# Patient Record
Sex: Female | Born: 1995 | Race: Black or African American | Hispanic: No | Marital: Single | Smoking: Never smoker
Health system: Southern US, Community
[De-identification: ages and names within clinical notes are randomized; demographics above are authoritative.]

## PROBLEM LIST (undated history)

## (undated) DIAGNOSIS — D649 Anemia, unspecified: Secondary | ICD-10-CM

## (undated) DIAGNOSIS — O1490 Unspecified pre-eclampsia, unspecified trimester: Secondary | ICD-10-CM

## (undated) HISTORY — PX: NO PAST SURGERIES: SHX2092

---

## 2016-02-14 ENCOUNTER — Encounter (HOSPITAL_COMMUNITY): Payer: Self-pay | Admitting: Emergency Medicine

## 2016-02-14 ENCOUNTER — Emergency Department (HOSPITAL_COMMUNITY)
Admission: EM | Admit: 2016-02-14 | Discharge: 2016-02-15 | Disposition: A | Discharge status: Home / Self Care | Payer: BLUE CROSS/BLUE SHIELD | Attending: Emergency Medicine | Admitting: Emergency Medicine

## 2016-02-14 DIAGNOSIS — J029 Acute pharyngitis, unspecified: Secondary | ICD-10-CM | POA: Insufficient documentation

## 2016-02-14 DIAGNOSIS — R319 Hematuria, unspecified: Secondary | ICD-10-CM | POA: Diagnosis present

## 2016-02-14 DIAGNOSIS — N390 Urinary tract infection, site not specified: Secondary | ICD-10-CM | POA: Diagnosis not present

## 2016-02-14 DIAGNOSIS — Z3202 Encounter for pregnancy test, result negative: Secondary | ICD-10-CM | POA: Diagnosis not present

## 2016-02-14 MED ORDER — NAPROXEN 500 MG PO TABS
500.0000 mg | ORAL_TABLET | Freq: Once | ORAL | Status: AC
  Administered 2016-02-15: 500 mg via ORAL
  Filled 2016-02-14: qty 1

## 2016-02-14 NOTE — ED Notes (Signed)
Pt states she has not felt well for the past couple of days  Pt is c/o sore throat, general fatigue, cough, decreased appetite, and chills  Pt states today she coughed up some blood

## 2016-02-14 NOTE — ED Provider Notes (Signed)
CSN: [id number]     Arrival date & time 02/14/16  2209 History  By signing my name below, I, [facility], attest that this documentation has been prepared under the direction and in the presence of [employer], PA-C. Electronically Signed: Randell Patient, ED Scribe. 02/15/2016. 1:51 AM.   Chief Complaint  Patient presents with  . Sore Throat  . Fatigue   The history is provided by the patient. No language interpreter was used.   HPI Comments: Belkis Norbeck is a 20 y.o. female with who presents to the Emergency Department complaining of intermittent mild, unchanging productive cough onset 2 days ago. Patient reports general malaise for the past 2 days a followed by sore throat and a cough productive of bright red blood-tinged mucus. She endorses constant lower back pain, chills, hematuria, and frequency that presented simultaneously with her other symptoms. She has not taken any medications. Per patient, she has a hx of bladder infections but notes that these past episodes did not occur with a sore throat or cough. She denies dysuria and bladder incontinence.   History reviewed. No pertinent past medical history. History reviewed. No pertinent past surgical history. History reviewed. No pertinent family history. Social History  Substance Use Topics  . Smoking status: Never Smoker   . Smokeless tobacco: None  . Alcohol Use: No   OB History    No data available      Review of Systems  Constitutional: Positive for chills.  HENT: Positive for sore throat.   Respiratory: Positive for cough.   Genitourinary: Positive for frequency and hematuria. Negative for dysuria.  All other systems reviewed and are negative.   Allergies  Review of patient's allergies indicates no known allergies.  Home Medications   Prior to Admission medications   Medication Sig Start Date End Date Taking? Authorizing Provider  cephALEXin (KEFLEX) 500 MG capsule Take 1 capsule (500 mg total) by  mouth 2 (two) times daily. 02/15/16   Antony Madura, PA-C  ibuprofen (ADVIL,MOTRIN) 600 MG tablet Take 1 tablet (600 mg total) by mouth every 6 (six) hours as needed. 02/15/16   Antony Madura, PA-C   BP 106/65 mmHg  Pulse 90  Temp(Src) 98.1 F (36.7 C) (Oral)  Resp 16  SpO2 100%  LMP 01/29/2016 (Exact Date)   Physical Exam  Constitutional: She is oriented to person, place, and time. She appears well-developed and well-nourished. No distress.  Nontoxic/nonseptic appearing  HENT:  Head: Normocephalic and atraumatic.  Mouth/Throat: Oropharynx is clear and moist. No oropharyngeal exudate.  Oropharynx clear. Uvula midline. Patient tolerating secretions without difficulty.  Eyes: Conjunctivae and EOM are normal. No scleral icterus.  Neck: Normal range of motion. Neck supple. No tracheal deviation present.  Cardiovascular: Normal rate, regular rhythm and intact distal pulses.   Pulmonary/Chest: Effort normal and breath sounds normal. No respiratory distress. She has no wheezes. She has no rales.  Abdominal: Soft. She exhibits no distension. There is no tenderness. There is no rebound.  Musculoskeletal: Normal range of motion.  Neurological: She is alert and oriented to person, place, and time. She exhibits normal muscle tone. Coordination normal.  Ambulatory with steady gait.  Skin: Skin is warm and dry. No rash noted. She is not diaphoretic. No erythema. No pallor.  Psychiatric: She has a normal mood and affect. Her behavior is normal.  Nursing note and vitals reviewed.   ED Course  Procedures   DIAGNOSTIC STUDIES: Oxygen Saturation is 99% on RA, normal by my interpretation.  COORDINATION OF CARE: 12:00 AM Will order labs and medications. Discussed treatment plan with pt at bedside and pt agreed to plan.  Labs Review Labs Reviewed  URINALYSIS, ROUTINE W REFLEX MICROSCOPIC (NOT AT [facility]) - Abnormal; Notable for the following:    APPearance CLOUDY (*)    Hgb urine dipstick TRACE (*)     Nitrite POSITIVE (*)    Leukocytes, UA MODERATE (*)    All other components within normal limits  URINE MICROSCOPIC-ADD ON - Abnormal; Notable for the following:    Squamous Epithelial / LPF 0-5 (*)    Bacteria, UA MANY (*)    Crystals CA OXALATE CRYSTALS (*)    All other components within normal limits  RAPID STREP SCREEN (NOT AT [facility])  CULTURE, GROUP A STREP [facility])  URINE CULTURE  PREGNANCY, URINE    Imaging Review No results found.   I have personally reviewed and evaluated these images and lab results as part of my medical decision-making.   EKG Interpretation None      MDM   Final diagnoses:  UTI (lower urinary tract infection)    Pt has been diagnosed with a UTI. Pt is afebrile, no CVA tenderness, and normotensive. No c/o nausea. No vomiting since ED arrival. Pt to be dc home with antibiotics and instructions to follow up with PCP if symptoms persist. Return precautions given at discharge. Patient discharged in good condition with no unaddressed concerns  I personally performed the services described in this documentation, which was scribed in my presence. The recorded information has been reviewed and is accurate.    Filed Vitals:   02/14/16 2225 02/15/16 0149  BP: 117/79 106/65  Pulse: 96 90  Temp: 98.1 F (36.7 C)   TempSrc: Oral   Resp: 18 16  SpO2: 99% 100%      Antony Madura, PA-C 02/15/16 0153  Raeford Razor, MD 02/15/16 2253

## 2016-02-15 DIAGNOSIS — N390 Urinary tract infection, site not specified: Secondary | ICD-10-CM | POA: Diagnosis not present

## 2016-02-15 LAB — URINE MICROSCOPIC-ADD ON

## 2016-02-15 LAB — URINALYSIS, ROUTINE W REFLEX MICROSCOPIC
Bilirubin Urine: NEGATIVE
GLUCOSE, UA: NEGATIVE mg/dL
Ketones, ur: NEGATIVE mg/dL
Nitrite: POSITIVE — AB
PROTEIN: NEGATIVE mg/dL
SPECIFIC GRAVITY, URINE: 1.028 (ref 1.005–1.030)
pH: 5.5 (ref 5.0–8.0)

## 2016-02-15 LAB — RAPID STREP SCREEN (MED CTR MEBANE ONLY): Streptococcus, Group A Screen (Direct): NEGATIVE

## 2016-02-15 LAB — PREGNANCY, URINE: Preg Test, Ur: NEGATIVE

## 2016-02-15 MED ORDER — CEPHALEXIN 500 MG PO CAPS: 500.0000 mg | ORAL_CAPSULE | Freq: Two times a day (BID) | ORAL | Status: DC

## 2016-02-15 MED ORDER — IBUPROFEN 600 MG PO TABS: 600.0000 mg | ORAL_TABLET | Freq: Four times a day (QID) | ORAL | Status: DC | PRN

## 2016-02-15 NOTE — Discharge Instructions (Signed)

## 2016-02-17 LAB — URINE CULTURE

## 2016-02-17 LAB — CULTURE, GROUP A STREP (THRC)

## 2016-02-19 ENCOUNTER — Telehealth (HOSPITAL_COMMUNITY): Payer: Self-pay

## 2016-02-19 NOTE — Telephone Encounter (Signed)
Post ED Visit - Positive Culture Follow-up  Culture report reviewed by antimicrobial stewardship pharmacist:   Enzo Bi, Pharm.D.  Celedonio Miyamoto, Pharm.D., BCPS  Garvin Fila, Pharm.D.  Georgina Pillion, Pharm.D., BCPS  [place], [address].D., BCPS, AAHIVP  Estella Husk, Pharm.D., BCPS, AAHIVP  Cassie Stewart, Pharm.D.  Sherle Poe, [address].D. Carmon Sails Rumbarger, Pharm.D.  Positive urine culture, >/= 100,000 colonies -> E Coli Treated with Cephalexin, organism sensitive to the same and no further patient follow-up is required at this time.  Arvid Right 02/19/2016, 5:46 AM

## 2017-01-31 ENCOUNTER — Emergency Department (HOSPITAL_COMMUNITY)
Admission: EM | Admit: 2017-01-31 | Discharge: 2017-01-31 | Disposition: A | Discharge status: Home / Self Care | Payer: Medicaid Other | Attending: Emergency Medicine | Admitting: Emergency Medicine

## 2017-01-31 ENCOUNTER — Emergency Department (HOSPITAL_COMMUNITY): Payer: Medicaid Other

## 2017-01-31 ENCOUNTER — Encounter (HOSPITAL_COMMUNITY): Payer: Self-pay

## 2017-01-31 DIAGNOSIS — Z79899 Other long term (current) drug therapy: Secondary | ICD-10-CM | POA: Insufficient documentation

## 2017-01-31 DIAGNOSIS — R05 Cough: Secondary | ICD-10-CM | POA: Diagnosis present

## 2017-01-31 DIAGNOSIS — J029 Acute pharyngitis, unspecified: Secondary | ICD-10-CM | POA: Insufficient documentation

## 2017-01-31 DIAGNOSIS — R6889 Other general symptoms and signs: Secondary | ICD-10-CM

## 2017-01-31 LAB — RAPID STREP SCREEN (MED CTR MEBANE ONLY): STREPTOCOCCUS, GROUP A SCREEN (DIRECT): NEGATIVE

## 2017-01-31 MED ORDER — HYDROCODONE-ACETAMINOPHEN 7.5-325 MG/15ML PO SOLN: 15.0000 mL | Freq: Three times a day (TID) | ORAL | 0 refills | Status: DC | PRN

## 2017-01-31 MED ORDER — PROMETHAZINE HCL 25 MG PO TABS: 25.0000 mg | ORAL_TABLET | Freq: Four times a day (QID) | ORAL | 0 refills | Status: DC | PRN

## 2017-01-31 MED ORDER — IBUPROFEN 600 MG PO TABS: 600.0000 mg | ORAL_TABLET | Freq: Four times a day (QID) | ORAL | 0 refills | Status: DC | PRN

## 2017-01-31 MED ORDER — BENZONATATE 100 MG PO CAPS
200.0000 mg | ORAL_CAPSULE | Freq: Once | ORAL | Status: AC
  Administered 2017-01-31: 200 mg via ORAL
  Filled 2017-01-31: qty 2

## 2017-01-31 MED ORDER — ACETAMINOPHEN 325 MG PO TABS
650.0000 mg | ORAL_TABLET | Freq: Once | ORAL | Status: AC | PRN
  Administered 2017-01-31: 650 mg via ORAL
  Filled 2017-01-31: qty 2

## 2017-01-31 MED ORDER — SODIUM CHLORIDE 0.9 % IV BOLUS (SEPSIS)
1000.0000 mL | Freq: Once | INTRAVENOUS | Status: AC
  Administered 2017-01-31: 1000 mL via INTRAVENOUS

## 2017-01-31 MED ORDER — BENZONATATE 100 MG PO CAPS: 100.0000 mg | ORAL_CAPSULE | Freq: Three times a day (TID) | ORAL | 0 refills | Status: DC

## 2017-01-31 MED ORDER — KETOROLAC TROMETHAMINE 30 MG/ML IJ SOLN
30.0000 mg | Freq: Once | INTRAMUSCULAR | Status: AC
  Administered 2017-01-31: 30 mg via INTRAVENOUS
  Filled 2017-01-31: qty 1

## 2017-01-31 MED ORDER — ACETAMINOPHEN 500 MG PO TABS: 500.0000 mg | ORAL_TABLET | ORAL | 0 refills | Status: DC | PRN

## 2017-01-31 NOTE — ED Provider Notes (Signed)
[facility]-EMERGENCY DEPT Provider Note   CSN: [id number] Arrival date & time: 01/31/17  0015    History   Chief Complaint Chief Complaint  Patient presents with  . Cough    HPI Christina Velazquez is a 21 y.o. female.   21 year-old female presents to the emergency department for evaluation of cough 1 day. Symptoms associated with sore throat as well as congestion. Patient states that she became concerned because she coughed up mucus today that was streaked with blood. No clots or hematemesis. She did have one episode of emesis which was posttussive. She has not had any diarrhea, inability to swallow, drooling, shortness of breath, or sick contacts. No medications taken at home. Patient noted to be febrile on arrival to the ED.   The history is provided by the patient. No language interpreter was used.  Cough     History reviewed. No pertinent past medical history.  There are no active problems to display for this patient.   History reviewed. No pertinent surgical history.  OB History    No data available       Home Medications    Prior to Admission medications   Medication Sig Start Date End Date Taking? Authorizing Provider  medroxyPROGESTERone (DEPO-PROVERA) 150 MG/ML injection Inject 150 mg into the muscle every 3 (three) months.   Yes Historical Provider, MD  acetaminophen (TYLENOL) 500 MG tablet Take 1 tablet (500 mg total) by mouth every 4 (four) hours as needed for fever. 01/31/17   Antony MaduraKelly Dreamer Carillo, PA-C  benzonatate (TESSALON) 100 MG capsule Take 1 capsule (100 mg total) by mouth every 8 (eight) hours. 01/31/17   Antony MaduraKelly Mailynn Everly, PA-C  HYDROcodone-acetaminophen (HYCET) 7.5-325 mg/15 ml solution Take 15 mLs by mouth every 8 (eight) hours as needed (for sore throat pain). 01/31/17   Antony MaduraKelly Astrid Vides, PA-C  ibuprofen (ADVIL,MOTRIN) 600 MG tablet Take 1 tablet (600 mg total) by mouth every 6 (six) hours as needed for fever, headache, mild pain or moderate pain. 01/31/17   Antony MaduraKelly Baxter Gonzalez, PA-C    promethazine (PHENERGAN) 25 MG tablet Take 1 tablet (25 mg total) by mouth every 6 (six) hours as needed for nausea or vomiting. 01/31/17   Antony MaduraKelly Medardo Hassing, PA-C    Family History History reviewed. No pertinent family history.  Social History Social History  Substance Use Topics  . Smoking status: Never Smoker  . Smokeless tobacco: Never Used  . Alcohol use No     Allergies   Patient has no known allergies.   Review of Systems Review of Systems  Respiratory: Positive for cough.   Ten systems reviewed and are negative for acute change, except as noted in the HPI.    Physical Exam Updated Vital Signs BP 98/63 (BP Location: Right Arm)   Pulse 83   Temp 98.8 F (37.1 C) (Oral)   Resp 18   LMP 01/09/2017   SpO2 100%   Physical Exam  Constitutional: She is oriented to person, place, and time. She appears well-developed and well-nourished. No distress.  Nontoxic appearing and in no acute distress  HENT:  Head: Normocephalic and atraumatic.  Eyes: Conjunctivae and EOM are normal. No scleral icterus.  Neck: Normal range of motion.  No nuchal rigidity or meningismus  Cardiovascular: Normal rate, regular rhythm and intact distal pulses.   Pulmonary/Chest: Effort normal. No respiratory distress. She has no wheezes. She has no rales.  No cough appreciated. Lungs clear to auscultation bilaterally. Chest expansion symmetric.  Musculoskeletal: Normal range of motion.  Neurological:  She is alert and oriented to person, place, and time. She exhibits normal muscle tone. Coordination normal.  GCS 15. Patient moving all extremities.  Skin: Skin is warm and dry. No rash noted. She is not diaphoretic. No erythema. No pallor.  Psychiatric: She has a normal mood and affect. Her behavior is normal.  Nursing note and vitals reviewed.    ED Treatments / Results  Labs (all labs ordered are listed, but only abnormal results are displayed) Labs Reviewed  RAPID STREP SCREEN (NOT AT [facility])   CULTURE, GROUP A STREP [facility])    EKG  EKG Interpretation None       Radiology Dg Chest 2 View  Result Date: 01/31/2017 CLINICAL DATA:  Fever cough and sore throat EXAM: CHEST  2 VIEW COMPARISON:  None. FINDINGS: The heart size and mediastinal contours are within normal limits. Both lungs are clear. The visualized skeletal structures are unremarkable. IMPRESSION: No active cardiopulmonary disease. Electronically Signed   By: Jasmine Pang M.D.   On: 01/31/2017 01:01    Procedures Procedures (including critical care time)  Medications Ordered in ED Medications  acetaminophen (TYLENOL) tablet 650 mg (650 mg Oral Given 01/31/17 0043)  sodium chloride 0.9 % bolus 1,000 mL (0 mLs Intravenous Stopped 01/31/17 0531)  ketorolac (TORADOL) 30 MG/ML injection 30 mg (30 mg Intravenous Given 01/31/17 0407)  benzonatate (TESSALON) capsule 200 mg (200 mg Oral Given 01/31/17 0407)     Initial Impression / Assessment and Plan / ED Course  I have reviewed the triage vital signs and the nursing notes.  Pertinent labs & imaging results that were available during my care of the patient were reviewed by me and considered in my medical decision making (see chart for details).     Patient with symptoms consistent with viral illness. Suspect influenza given fever, sore throat, cough, congestion, and myalgias. Vitals are stable. Fever responding to antipyretics. No signs of dehydration, tolerating oral fluid. 1L IVF also given. Lungs are clear. CXR without evidence of PNA. No focal abdominal pain. Supportive therapy indicated with return if symptoms worsen. Patient discharged in satisfactory condition with no unaddressed concerns.   Final Clinical Impressions(s) / ED Diagnoses   Final diagnoses:  Flu-like symptoms    New Prescriptions Discharge Medication List as of 01/31/2017  5:30 AM    START taking these medications   Details  acetaminophen (TYLENOL) 500 MG tablet Take 1 tablet (500 mg total) by  mouth every 4 (four) hours as needed for fever., Starting Fri 01/31/2017, Print    benzonatate (TESSALON) 100 MG capsule Take 1 capsule (100 mg total) by mouth every 8 (eight) hours., Starting Fri 01/31/2017, Print    HYDROcodone-acetaminophen (HYCET) 7.5-325 mg/15 ml solution Take 15 mLs by mouth every 8 (eight) hours as needed (for sore throat pain)., Starting Fri 01/31/2017, Print    ibuprofen (ADVIL,MOTRIN) 600 MG tablet Take 1 tablet (600 mg total) by mouth every 6 (six) hours as needed for fever, headache, mild pain or moderate pain., Starting Fri 01/31/2017, Print    promethazine (PHENERGAN) 25 MG tablet Take 1 tablet (25 mg total) by mouth every 6 (six) hours as needed for nausea or vomiting., Starting Fri 01/31/2017, Print         [place], PA-C 01/31/17 1610    Pricilla Loveless, MD 02/03/17 1724

## 2017-01-31 NOTE — ED Triage Notes (Signed)
Pt states that she woke up coughing and spit up some blood, she also complains of being cold, pt has a fever

## 2017-01-31 NOTE — Discharge Instructions (Signed)
Your symptoms are likely due to a viral illness, possibly influenza. We advise the use of supportive care as well as plenty of rest. Drink plenty of clear liquids to prevent dehydration. Take ibuprofen for pain and Tylenol for fever. Follow-up with a primary care doctor if symptoms persist.

## 2017-02-01 LAB — CULTURE, GROUP A STREP (THRC)

## 2017-02-02 ENCOUNTER — Telehealth: Payer: Self-pay

## 2017-02-02 NOTE — Telephone Encounter (Signed)
[place] for symptom check of sore throat. States went to urgent  care and placed on Amoxicillin

## 2017-02-02 NOTE — Progress Notes (Signed)
ED Antimicrobial Stewardship Positive Culture Follow Up   Christina Velazquez is an 21 y.o. female who [facility] with a chief complaint of  Chief Complaint  Patient presents with  . Cough    Recent Results (from the past 720 hour(s))  Rapid strep screen     Status: None   Collection Time: 01/31/17 12:40 AM  Result Value Ref Range Status   Streptococcus, Group A Screen (Direct) NEGATIVE NEGATIVE Final    Comment: (NOTE) A Rapid Antigen test may result negative if the antigen level in the sample is below the detection level of this test. The FDA has not cleared this test as a stand-alone test therefore the rapid antigen negative result has reflexed to a Group A Strep culture.   Culture, group A strep     Status: None   Collection Time: 01/31/17 12:40 AM  Result Value Ref Range Status   Specimen Description THROAT  Final   Special Requests NONE Reflexed from [id number]  Final   Culture MODERATE GROUP A STREP (S.PYOGENES) ISOLATED  Final   Report Status 02/01/2017 FINAL  Final    []  Treated with N/A, organism resistant to prescribed antimicrobial [x]  Patient discharged originally without antimicrobial agent and treatment is now indicated  New antibiotic prescription: IF no improvement, amoxicillin 500mg  PO BID x 10 days  ED Provider: Rhona RaiderMercedes Street, PA   Shaynna Husby, Drake LeachRachel Lynn 02/02/2017, 10:53 AM Clinical Pharmacist Phone# [phone]

## 2017-03-11 ENCOUNTER — Emergency Department (HOSPITAL_COMMUNITY)
Admission: EM | Admit: 2017-03-11 | Discharge: 2017-03-11 | Disposition: A | Discharge status: Home / Self Care | Payer: Medicaid Other | Attending: Dermatology | Admitting: Dermatology

## 2017-03-11 ENCOUNTER — Encounter (HOSPITAL_COMMUNITY): Payer: Self-pay

## 2017-03-11 DIAGNOSIS — Z5321 Procedure and treatment not carried out due to patient leaving prior to being seen by health care provider: Secondary | ICD-10-CM | POA: Diagnosis not present

## 2017-03-11 DIAGNOSIS — M25511 Pain in right shoulder: Secondary | ICD-10-CM | POA: Insufficient documentation

## 2017-03-11 DIAGNOSIS — Y939 Activity, unspecified: Secondary | ICD-10-CM | POA: Diagnosis not present

## 2017-03-11 DIAGNOSIS — Y999 Unspecified external cause status: Secondary | ICD-10-CM | POA: Diagnosis not present

## 2017-03-11 DIAGNOSIS — Y92410 Unspecified street and highway as the place of occurrence of the external cause: Secondary | ICD-10-CM | POA: Insufficient documentation

## 2017-03-11 NOTE — ED Notes (Signed)
Came up to nurse first states she was leaving, encouraged to stay , states she would follow up with her chiropractor
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
## 2017-09-30 ENCOUNTER — Encounter (HOSPITAL_COMMUNITY): Payer: Self-pay

## 2017-09-30 ENCOUNTER — Emergency Department (HOSPITAL_COMMUNITY)
Admission: EM | Admit: 2017-09-30 | Discharge: 2017-09-30 | Disposition: A | Discharge status: Home / Self Care | Payer: BLUE CROSS/BLUE SHIELD | Attending: Emergency Medicine | Admitting: Emergency Medicine

## 2017-09-30 DIAGNOSIS — J029 Acute pharyngitis, unspecified: Secondary | ICD-10-CM | POA: Insufficient documentation

## 2017-09-30 DIAGNOSIS — R059 Cough, unspecified: Secondary | ICD-10-CM

## 2017-09-30 DIAGNOSIS — R05 Cough: Secondary | ICD-10-CM | POA: Diagnosis not present

## 2017-09-30 LAB — RAPID STREP SCREEN (MED CTR MEBANE ONLY): STREPTOCOCCUS, GROUP A SCREEN (DIRECT): NEGATIVE

## 2017-09-30 MED ORDER — BENZONATATE 100 MG PO CAPS: 100.0000 mg | ORAL_CAPSULE | Freq: Three times a day (TID) | ORAL | 0 refills | Status: DC

## 2017-09-30 MED ORDER — IBUPROFEN 400 MG PO TABS
600.0000 mg | ORAL_TABLET | Freq: Once | ORAL | Status: AC
  Administered 2017-09-30: 600 mg via ORAL
  Filled 2017-09-30: qty 1

## 2017-09-30 MED ORDER — DEXAMETHASONE SODIUM PHOSPHATE 10 MG/ML IJ SOLN
10.0000 mg | Freq: Once | INTRAMUSCULAR | Status: AC
  Administered 2017-09-30: 10 mg via INTRAMUSCULAR
  Filled 2017-09-30: qty 1

## 2017-09-30 MED ORDER — LIDOCAINE VISCOUS 2 % MT SOLN: 20.0000 mL | OROMUCOSAL | 0 refills | Status: DC | PRN

## 2017-09-30 NOTE — ED Notes (Signed)
Per provider, okay for nurse first to write pt a work/school note.

## 2017-09-30 NOTE — ED Provider Notes (Signed)
[facility]-EMERGENCY DEPT Provider Note   CSN: [id number] Arrival date & time: 09/30/17  1535     History   Chief Complaint Chief Complaint  Patient presents with  . Sore Throat    HPI Christina Velazquez is a 21 y.o. female.  HPI 21 year old [place] American female presents to the ED for evaluation of sore throat. Patient states that her throat started hurting 3 days ago. States it is painful to swallow. Patient also reports a nonproductive cough for the past 2-3 days. Denies any associated fevers, chills, rhinorrhea, sinus congestion, otalgia. Over-the-counter decongestant which has helped some. She also reports that her voice is slightly hoarse. States that she has history of tonsillitis and is wanting a referral to ENT for possible tonsillectomy. Denies any associated sick contacts. She states her sore throat is worse at night. Denies any associated chest pain, shortness of breath. History reviewed. No pertinent past medical history.  There are no active problems to display for this patient.   History reviewed. No pertinent surgical history.  OB History    No data available       Home Medications    Prior to Admission medications   Medication Sig Start Date End Date Taking? Authorizing Provider  acetaminophen (TYLENOL) 500 MG tablet Take 1 tablet (500 mg total) by mouth every 4 (four) hours as needed for fever. 01/31/17   Antony Madura, PA-C  benzonatate (TESSALON) 100 MG capsule Take 1 capsule (100 mg total) by mouth every 8 (eight) hours. 09/30/17   Rise Mu, PA-C  HYDROcodone-acetaminophen (HYCET) 7.5-325 mg/15 ml solution Take 15 mLs by mouth every 8 (eight) hours as needed (for sore throat pain). 01/31/17   Antony Madura, PA-C  ibuprofen (ADVIL,MOTRIN) 600 MG tablet Take 1 tablet (600 mg total) by mouth every 6 (six) hours as needed for fever, headache, mild pain or moderate pain. 01/31/17   Antony Madura, PA-C  lidocaine (XYLOCAINE) 2 % solution Use as directed 20 mLs in the  mouth or throat as needed for mouth pain. 09/30/17   Rise Mu, PA-C  medroxyPROGESTERone (DEPO-PROVERA) 150 MG/ML injection Inject 150 mg into the muscle every 3 (three) months.    [provider]  promethazine (PHENERGAN) 25 MG tablet Take 1 tablet (25 mg total) by mouth every 6 (six) hours as needed for nausea or vomiting. 01/31/17   Antony Madura, PA-C    Family History History reviewed. No pertinent family history.  Social History Social History  Substance Use Topics  . Smoking status: Never Smoker  . Smokeless tobacco: Never Used  . Alcohol use No     Allergies   Patient has no known allergies.   Review of Systems Review of Systems  Constitutional: Negative for chills and fever.  HENT: Positive for sore throat, trouble swallowing and voice change. Negative for congestion, sinus pain and sinus pressure.   Respiratory: Positive for cough. Negative for shortness of breath and wheezing.   Cardiovascular: Negative for chest pain.  Gastrointestinal: Negative for nausea and vomiting.     Physical Exam Updated Vital Signs BP 104/76 (BP Location: Right Arm)   Pulse 64   Temp 98.5 F (36.9 C) (Oral)   Resp 18   LMP 09/28/2017 (Exact Date)   SpO2 100%   Physical Exam  Constitutional: She appears well-developed and well-nourished. No distress.  HENT:  Head: Normocephalic and atraumatic.  Right Ear: Hearing, tympanic membrane and ear canal normal.  Left Ear: Hearing, tympanic membrane, external ear and ear canal normal.  Nose: Nose normal.  Mouth/Throat: Uvula is midline and mucous membranes are normal. No trismus in the jaw. No uvula swelling. Posterior oropharyngeal erythema present. No oropharyngeal exudate, posterior oropharyngeal edema or tonsillar abscesses. Tonsils are 1+ on the right. Tonsils are 1+ on the left. No tonsillar exudate.  Eyes: Right eye exhibits no discharge. Left eye exhibits no discharge. No scleral icterus.  Neck: Normal range of  motion. Neck supple.  Pulmonary/Chest: Effort normal and breath sounds normal. No respiratory distress. She has no wheezes. She has no rales. She exhibits no tenderness.  Musculoskeletal: Normal range of motion.  Lymphadenopathy:    She has no cervical adenopathy.  Neurological: She is alert.  Skin: No pallor.  Psychiatric: Her behavior is normal. Judgment and thought content normal.  Nursing note and vitals reviewed.    ED Treatments / Results  Labs (all labs ordered are listed, but only abnormal results are displayed) Labs Reviewed  RAPID STREP SCREEN (NOT AT [facility])  CULTURE, GROUP A STREP [facility])    EKG  EKG Interpretation None       Radiology No results found.  Procedures Procedures (including critical care time)  Medications Ordered in ED Medications  dexamethasone (DECADRON) injection 10 mg (not administered)  ibuprofen (ADVIL,MOTRIN) tablet 600 mg (not administered)     Initial Impression / Assessment and Plan / ED Course  I have reviewed the triage vital signs and the nursing notes.  Pertinent labs & imaging results that were available during my care of the patient were reviewed by me and considered in my medical decision making (see chart for details).     Patient presents to the ED with complaints of sore throat and nonproductive cough for the past 3 days. Denies any associated fever, chills, rhinorrhea, otalgia, chest pain, shortness of breath. Patient is overall well-appearing and nontoxic. Vital signs are reassuring. Lungs clear to auscultation bilaterally. No signs of peritonsillar abscess and doubt deep neck infection. Strep test was negative. The patient's symptoms likely consistent with laryngitis. Low suspicion for pneumonia and do not feel that any imaging is indicated at this time. Patient was treated with steroids and cough medicine. Encouraged him to medical treatment at home. Have given her ENT follow-up.  Pt is hemodynamically stable, in NAD, &  able to ambulate in the ED. Evaluation does not show pathology that would require ongoing emergent intervention or inpatient treatment. I explained the diagnosis to the patient. Pain has been managed & has no complaints prior to dc. Pt is comfortable with above plan and is stable for discharge at this time. All questions were answered prior to disposition. Strict return precautions for f/u to the ED were discussed. Encouraged follow up with PCP.   Final Clinical Impressions(s) / ED Diagnoses   Final diagnoses:  Sore throat  Cough    New Prescriptions New Prescriptions   BENZONATATE (TESSALON) 100 MG CAPSULE    Take 1 capsule (100 mg total) by mouth every 8 (eight) hours.   LIDOCAINE (XYLOCAINE) 2 % SOLUTION    Use as directed 20 mLs in the mouth or throat as needed for mouth pain.     Rise Mu, PA-C 09/30/17 1641    Benjiman Core, MD 09/30/17 [phone]

## 2017-09-30 NOTE — ED Triage Notes (Signed)
Pt reports sore throat X3 days. She reports cough as well. Reports sore throat is worse at night. Mild tonsillar inflammation noted. Voice slightly hoarse, airway intact.

## 2017-09-30 NOTE — Discharge Instructions (Signed)
Your strep test was negative. Had been given steroids in the ED. Take Motrin and Tylenol home for pain. Have your Tessalon for your cough. Use the lidocaine viscus rinse to help with her sore throat. Follow up with a primary care doctor if her symptoms do not improve in the next 6-7 days.

## 2017-09-30 NOTE — ED Notes (Signed)
Declined W/C at D/C and was escorted to lobby by RN. 

## 2017-10-01 LAB — CULTURE, GROUP A STREP (THRC)

## 2017-10-05 ENCOUNTER — Encounter (HOSPITAL_COMMUNITY): Payer: Self-pay | Admitting: Emergency Medicine

## 2017-10-05 ENCOUNTER — Ambulatory Visit (HOSPITAL_COMMUNITY)
Admission: EM | Admit: 2017-10-05 | Discharge: 2017-10-05 | Disposition: A | Discharge status: Home / Self Care | Payer: BLUE CROSS/BLUE SHIELD | Attending: Emergency Medicine | Admitting: Emergency Medicine

## 2017-10-05 DIAGNOSIS — K1379 Other lesions of oral mucosa: Secondary | ICD-10-CM

## 2017-10-05 MED ORDER — IBUPROFEN 800 MG PO TABS
800.0000 mg | ORAL_TABLET | Freq: Once | ORAL | Status: AC
  Administered 2017-10-05: 800 mg via ORAL

## 2017-10-05 MED ORDER — IBUPROFEN 600 MG PO TABS: 600.0000 mg | ORAL_TABLET | Freq: Three times a day (TID) | ORAL | 0 refills | Status: DC | PRN

## 2017-10-05 MED ORDER — IBUPROFEN 800 MG PO TABS
ORAL_TABLET | ORAL | Status: AC
  Filled 2017-10-05: qty 1

## 2017-10-05 MED ORDER — AMOXICILLIN-POT CLAVULANATE 875-125 MG PO TABS
1.0000 | ORAL_TABLET | Freq: Two times a day (BID) | ORAL | 0 refills | Status: DC
Start: 2017-10-05 — End: 2019-07-09

## 2017-10-05 NOTE — ED Provider Notes (Signed)
[facility]    CSN: [id number] Arrival date & time: 10/05/17  1819     History   Chief Complaint Chief Complaint  Patient presents with  . Dental Pain    HPI  Christina Velazquez is a 21 y.o. female.  The patient presenting today with complaints of pain in the "roof of her mouth". Patient states that she was seen the other day for similar symptoms and was given something to swish which temporarily relieved the discomfort. Patient states she has been sick for approximately one week.  States her mouth is uncomfortable she does not feel like she can eat. Denies sucking on anything that could've caused any injury or discomfort. Denies significant medical history or medications. States she was given a steroid shot a few days ago but has not been placed on any antibiotics.  HPI  History reviewed. No pertinent past medical history.  There are no active problems to display for this patient.   History reviewed. No pertinent surgical history.  OB History    No data available       Home Medications    Prior to Admission medications   Medication Sig Start Date End Date Taking? Authorizing Provider  acetaminophen (TYLENOL) 500 MG tablet Take 1 tablet (500 mg total) by mouth every 4 (four) hours as needed for fever. 01/31/17   Antony Madura, PA-C  amoxicillin-clavulanate (AUGMENTIN) 875-125 MG tablet Take 1 tablet by mouth every 12 (twelve) hours. 10/05/17   Servando Salina, NP  benzonatate (TESSALON) 100 MG capsule Take 1 capsule (100 mg total) by mouth every 8 (eight) hours. 09/30/17   Rise Mu, PA-C  HYDROcodone-acetaminophen (HYCET) 7.5-325 mg/15 ml solution Take 15 mLs by mouth every 8 (eight) hours as needed (for sore throat pain). 01/31/17   Antony Madura, PA-C  ibuprofen (ADVIL,MOTRIN) 600 MG tablet Take 1 tablet (600 mg total) by mouth every 8 (eight) hours as needed. 10/05/17   Servando Salina, NP  lidocaine (XYLOCAINE) 2 % solution Use as directed 20 mLs in the  mouth or throat as needed for mouth pain. 09/30/17   Rise Mu, PA-C  medroxyPROGESTERone (DEPO-PROVERA) 150 MG/ML injection Inject 150 mg into the muscle every 3 (three) months.    [provider]  promethazine (PHENERGAN) 25 MG tablet Take 1 tablet (25 mg total) by mouth every 6 (six) hours as needed for nausea or vomiting. 01/31/17   Antony Madura, PA-C    Family History History reviewed. No pertinent family history.  Social History Social History  Substance Use Topics  . Smoking status: Never Smoker  . Smokeless tobacco: Never Used  . Alcohol use No     Allergies   Patient has no known allergies.   Review of Systems Review of Systems  Constitutional: Negative.  Negative for fatigue and fever.  HENT: Negative.   Eyes: Negative.  Negative for visual disturbance.  Respiratory: Negative.  Negative for cough and shortness of breath.   Cardiovascular: Negative.  Negative for chest pain and leg swelling.  Gastrointestinal: Negative.   Endocrine: Negative.   Genitourinary: Negative.   Musculoskeletal: Negative.  Negative for gait problem and neck stiffness.  Skin: Negative.   Allergic/Immunologic: Negative.   Neurological: Negative.  Negative for dizziness and headaches.  Hematological: Negative.   Psychiatric/Behavioral: Negative.      Physical Exam Triage Vital Signs ED Triage Vitals [10/05/17 1832]  Enc Vitals Group     BP 124/78     Pulse Rate 67  Resp 18     Temp 98.4 F (36.9 C)     Temp Source Oral     SpO2 100 %     Weight      Height      Head Circumference      Peak Flow      Pain Score 9     Pain Loc      Pain Edu?      Excl. in GC?    No data found.   Updated Vital Signs BP 124/78 (BP Location: Right Arm)   Pulse 67   Temp 98.4 F (36.9 C) (Oral)   Resp 18   LMP 09/28/2017 (Exact Date)   SpO2 100%   Visual Acuity Right Eye Distance:   Left Eye Distance:   Bilateral Distance:    Right Eye Near:   Left Eye Near:      Bilateral Near:     Physical Exam  Constitutional: She appears well-developed and well-nourished. No distress.  HENT:  Mouth/Throat: Uvula is midline and mucous membranes are normal. No tonsillar exudate.    Eyes: Pupils are equal, round, and reactive to light. Conjunctivae are normal. Right eye exhibits no discharge. Left eye exhibits no discharge. No scleral icterus.  Neck: Normal range of motion. Neck supple. No thyromegaly present.  Cardiovascular: Normal rate, regular rhythm, normal heart sounds and intact distal pulses.  Exam reveals no gallop and no friction rub.   No murmur heard. Pulmonary/Chest: Effort normal and breath sounds normal. No respiratory distress. She has no wheezes. She has no rales. She exhibits no tenderness.  Skin: She is not diaphoretic.  Nursing note and vitals reviewed.    UC Treatments / Results  Labs (all labs ordered are listed, but only abnormal results are displayed) Labs Reviewed - No data to display  EKG  EKG Interpretation None       Radiology No results found.  Procedures Procedures (including critical care time)  Medications Ordered in UC Medications  ibuprofen (ADVIL,MOTRIN) tablet 800 mg (800 mg Oral Given 10/05/17 1851)     Initial Impression / Assessment and Plan / UC Course  I have reviewed the triage vital signs and the nursing notes.  Pertinent labs & imaging results that were available during my care of the patient were reviewed by me and considered in my medical decision making (see chart for details).     Patient given ibuprofen for pain and placed on an antibiotic.  Concern for abscess/infection.  To call ENT to follow up in am or go to ED if symptoms require to be seen sooner.   Plan of care was discussed with Dr. Ria Clock.   Final Clinical Impressions(s) / UC Diagnoses   Final diagnoses:  Mouth pain    New Prescriptions Discharge Medication List as of 10/05/2017  6:47 PM    START taking these  medications   Details  amoxicillin-clavulanate (AUGMENTIN) 875-125 MG tablet Take 1 tablet by mouth every 12 (twelve) hours., Starting Sun 10/05/2017, Normal        Controlled Substance Prescriptions [place] Controlled Substance Registry consulted? Not Applicable   The usual and customary discharge instructions and warnings were given.  The patient verbalizes understanding and agrees to plan of care.      Servando Salina, NP 10/05/17 Windell Moment

## 2017-10-05 NOTE — ED Triage Notes (Signed)
Pt sts pain in roof of mouth x 3 days

## 2017-10-05 NOTE — Discharge Instructions (Signed)
Call Dr. Thurmon Fair office tomorrow and schedule a visit for follow up.

## 2018-12-20 ENCOUNTER — Emergency Department (HOSPITAL_COMMUNITY)
Admission: EM | Admit: 2018-12-20 | Discharge: 2018-12-20 | Disposition: A | Discharge status: Home / Self Care | Payer: Self-pay | Attending: Emergency Medicine | Admitting: Emergency Medicine

## 2018-12-20 ENCOUNTER — Emergency Department (HOSPITAL_COMMUNITY): Payer: Self-pay

## 2018-12-20 ENCOUNTER — Other Ambulatory Visit: Payer: Self-pay

## 2018-12-20 DIAGNOSIS — W208XXA Other cause of strike by thrown, projected or falling object, initial encounter: Secondary | ICD-10-CM | POA: Insufficient documentation

## 2018-12-20 DIAGNOSIS — Y990 Civilian activity done for income or pay: Secondary | ICD-10-CM | POA: Insufficient documentation

## 2018-12-20 DIAGNOSIS — Y939 Activity, unspecified: Secondary | ICD-10-CM | POA: Insufficient documentation

## 2018-12-20 DIAGNOSIS — S90111A Contusion of right great toe without damage to nail, initial encounter: Secondary | ICD-10-CM | POA: Insufficient documentation

## 2018-12-20 DIAGNOSIS — Y9289 Other specified places as the place of occurrence of the external cause: Secondary | ICD-10-CM | POA: Insufficient documentation

## 2018-12-20 DIAGNOSIS — Z79899 Other long term (current) drug therapy: Secondary | ICD-10-CM | POA: Insufficient documentation

## 2018-12-20 MED ORDER — IBUPROFEN 400 MG PO TABS
600.0000 mg | ORAL_TABLET | Freq: Once | ORAL | Status: AC
  Administered 2018-12-20: 600 mg via ORAL
  Filled 2018-12-20: qty 1

## 2018-12-20 MED ORDER — IBUPROFEN 600 MG PO TABS: 600.0000 mg | ORAL_TABLET | Freq: Three times a day (TID) | ORAL | 0 refills | Status: DC | PRN

## 2018-12-20 NOTE — Discharge Instructions (Addendum)
Please read instructions below. Apply ice to your toe for 20 minutes at a time. You can take ibuprofen every 6 hours as needed for pain. Schedule an appointment with your primary care provider if symptoms persist.  Return to the ER for new or concerning symptoms.

## 2018-12-20 NOTE — ED Notes (Signed)
Patient verbalizes understanding of discharge instructions. Opportunity for questioning and answers were provided. Armband removed by staff, pt discharged from ED ambulatory.   

## 2018-12-20 NOTE — ED Provider Notes (Signed)
[facility] Provider [facility]   CSN: [id number] Arrival date & time: 12/20/18  1344     History   Chief Complaint No chief complaint on file.   HPI Christina Velazquez is a 22 y.o. female without significant PMHx, presenting from work with right foot pain after a wooden shelf fell onto her foot.  She has associated pain and brusing to the first toe, at the base of the toe nail. Pain is made worse with palpation, mild.  Pain is 5/10. No medications tried PTA.  The history is provided by the patient.    No past medical history on file.  There are no active problems to display for this patient.   No past surgical history on file.   OB History   No obstetric history on file.      Home Medications    Prior to Admission medications   Medication Sig Start Date End Date Taking? Authorizing Provider  acetaminophen (TYLENOL) 500 MG tablet Take 1 tablet (500 mg total) by mouth every 4 (four) hours as needed for fever. 01/31/17   Antony MaduraHumes, Kelly, PA-C  amoxicillin-clavulanate (AUGMENTIN) 875-125 MG tablet Take 1 tablet by mouth every 12 (twelve) hours. 10/05/17   Servando Salinaossi, Catherine H, NP  benzonatate (TESSALON) 100 MG capsule Take 1 capsule (100 mg total) by mouth every 8 (eight) hours. 09/30/17   Rise MuLeaphart, Kenneth T, PA-C  HYDROcodone-acetaminophen (HYCET) 7.5-325 mg/15 ml solution Take 15 mLs by mouth every 8 (eight) hours as needed (for sore throat pain). 01/31/17   Antony MaduraHumes, Kelly, PA-C  ibuprofen (ADVIL,MOTRIN) 600 MG tablet Take 1 tablet (600 mg total) by mouth every 8 (eight) hours as needed. 10/05/17   Servando Salinaossi, Catherine H, NP  lidocaine (XYLOCAINE) 2 % solution Use as directed 20 mLs in the mouth or throat as needed for mouth pain. 09/30/17   Rise MuLeaphart, Kenneth T, PA-C  medroxyPROGESTERone (DEPO-PROVERA) 150 MG/ML injection Inject 150 mg into the muscle every 3 (three) months.    [provider]  promethazine (PHENERGAN) 25 MG tablet Take 1 tablet (25 mg  total) by mouth every 6 (six) hours as needed for nausea or vomiting. 01/31/17   Antony MaduraHumes, Kelly, PA-C    Family History No family history on file.  Social History Social History   Tobacco Use  . Smoking status: Never Smoker  . Smokeless tobacco: Never Used  Substance Use Topics  . Alcohol use: No  . Drug use: No     Allergies   Patient has no known allergies.   Review of Systems Review of Systems  Musculoskeletal: Positive for myalgias.  Skin: Positive for color change. Negative for wound.     Physical Exam Updated Vital Signs BP 93/62 (BP Location: Left Arm)   Pulse 68   Temp 98.3 F (36.8 C) (Oral)   Ht 4\' 10"  (1.473 m)   Wt 54.4 kg   LMP 12/17/2018 (Exact Date)   SpO2 100%   BMI 25.08 kg/m   Physical Exam Vitals signs and nursing note reviewed.  Constitutional:      General: She is not in acute distress.    Appearance: She is well-developed.  HENT:     Head: Normocephalic and atraumatic.  Eyes:     Conjunctiva/sclera: Conjunctivae normal.  Cardiovascular:     Rate and Rhythm: Normal rate.  Pulmonary:     Effort: Pulmonary effort is normal.  Musculoskeletal:     Comments: There is localized area of bruising to the some of the  distal right first toe at the base of the toenail.  Appears to be a blood-filled blister.  Associated tenderness noted.  Normal active range of motion.  No obvious swelling or deformity.  No subungual hematoma or damage to the nail.  Neurological:     Mental Status: She is alert.  Psychiatric:        Mood and Affect: Mood normal.        Behavior: Behavior normal.      ED Treatments / Results  Labs (all labs ordered are listed, but only abnormal results are displayed) Labs Reviewed - No data to display  EKG None  Radiology No results found.  Procedures Procedures (including critical care time)  Medications Ordered in ED Medications - No data to display   Initial Impression / Assessment and Plan / ED Course  I have  reviewed the triage vital signs and the nursing notes.  Pertinent labs & imaging results that were available during my care of the patient were reviewed by me and considered in my medical decision making (see chart for details).     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  Associated mild pain and bruising to the skin at the base of the toenail. No subungual hematoma or damage to the nail.  Normal range of motion.  X-ray ordered to rule out fracture. Pain treated with ibuprofen.  Care handed off at shift change to PA [place].  Follow-up x-ray results.  If negative, conservative treatment with RICE, NSAIDs for pain.  Final Clinical Impressions(s) / ED Diagnoses   Final diagnoses:  None    ED Discharge Orders    None       Kimaya Whitlatch, [place] N, PA-C 12/20/18 1441    Gwyneth SproutPlunkett, Whitney, MD 12/20/18 1918

## 2018-12-20 NOTE — ED Triage Notes (Signed)
Pt presents to ED for after a board fell on her foot at work today. Pt denies difficulty ambulating. Slight swelling per pt.

## 2019-07-09 ENCOUNTER — Encounter (HOSPITAL_COMMUNITY): Payer: Self-pay | Admitting: Emergency Medicine

## 2019-07-09 ENCOUNTER — Ambulatory Visit (HOSPITAL_COMMUNITY)
Admission: EM | Admit: 2019-07-09 | Discharge: 2019-07-09 | Disposition: A | Discharge status: Home / Self Care | Payer: Medicaid Other | Attending: Emergency Medicine | Admitting: Emergency Medicine

## 2019-07-09 ENCOUNTER — Other Ambulatory Visit: Payer: Self-pay

## 2019-07-09 ENCOUNTER — Telehealth: Payer: Self-pay | Admitting: Family Medicine

## 2019-07-09 ENCOUNTER — Emergency Department (HOSPITAL_COMMUNITY)
Admission: EM | Admit: 2019-07-09 | Discharge: 2019-07-10 | Disposition: A | Discharge status: Home / Self Care | Payer: Medicaid Other | Attending: Emergency Medicine | Admitting: Emergency Medicine

## 2019-07-09 DIAGNOSIS — Z3A00 Weeks of gestation of pregnancy not specified: Secondary | ICD-10-CM | POA: Insufficient documentation

## 2019-07-09 DIAGNOSIS — K219 Gastro-esophageal reflux disease without esophagitis: Secondary | ICD-10-CM | POA: Diagnosis not present

## 2019-07-09 DIAGNOSIS — Z3201 Encounter for pregnancy test, result positive: Secondary | ICD-10-CM

## 2019-07-09 DIAGNOSIS — R0789 Other chest pain: Secondary | ICD-10-CM | POA: Insufficient documentation

## 2019-07-09 DIAGNOSIS — O219 Vomiting of pregnancy, unspecified: Secondary | ICD-10-CM | POA: Diagnosis present

## 2019-07-09 DIAGNOSIS — R112 Nausea with vomiting, unspecified: Secondary | ICD-10-CM

## 2019-07-09 DIAGNOSIS — Z3491 Encounter for supervision of normal pregnancy, unspecified, first trimester: Secondary | ICD-10-CM

## 2019-07-09 LAB — POCT PREGNANCY, URINE: Preg Test, Ur: POSITIVE — AB

## 2019-07-09 MED ORDER — DOXYLAMINE SUCCINATE (SLEEP) 25 MG PO TABS: 25.0000 mg | ORAL_TABLET | Freq: Every evening | ORAL | 0 refills | Status: DC | PRN

## 2019-07-09 MED ORDER — LIDOCAINE VISCOUS HCL 2 % MT SOLN
OROMUCOSAL | Status: AC
  Filled 2019-07-09: qty 15

## 2019-07-09 MED ORDER — VITAMIN B-6 25 MG PO TABS: 25.0000 mg | ORAL_TABLET | Freq: Two times a day (BID) | ORAL | 0 refills | Status: DC | PRN

## 2019-07-09 MED ORDER — ALUM & MAG HYDROXIDE-SIMETH 200-200-20 MG/5ML PO SUSP
30.0000 mL | Freq: Once | ORAL | Status: AC
  Administered 2019-07-09: 30 mL via ORAL

## 2019-07-09 MED ORDER — SODIUM CHLORIDE 0.9% FLUSH: 3.0000 mL | Freq: Once | INTRAVENOUS | Status: DC

## 2019-07-09 MED ORDER — LIDOCAINE VISCOUS HCL 2 % MT SOLN
15.0000 mL | Freq: Once | OROMUCOSAL | Status: AC
  Administered 2019-07-09: 15 mL via ORAL

## 2019-07-09 MED ORDER — ALUM & MAG HYDROXIDE-SIMETH 200-200-20 MG/5ML PO SUSP
ORAL | Status: AC
  Filled 2019-07-09: qty 30

## 2019-07-09 MED ORDER — ACETAMINOPHEN 325 MG PO TABS: 650.0000 mg | ORAL_TABLET | Freq: Once | ORAL | Status: DC

## 2019-07-09 NOTE — Telephone Encounter (Signed)
Attempted to call patient x3 about her appointment on 7/20 @ 10:20. They Phone was answer all three times but no one said anything. Could not leave a voicemail because the phone kept being answered but no one was saying anything.

## 2019-07-09 NOTE — ED Provider Notes (Addendum)
MRN: [id number] DOB: Aug 15, 1996  Subjective:   Christina Velazquez is a 23 y.o. female presenting for 2 day history of acute onset, worsening, moderate to severe n/v (~9 episodes today), heartburn. Has difficulty holding foods down in the past 2 days.  LMP was in April 2020, was regular.  Patient has had negative pregnancy test in May and June.  She took a pregnancy test at home and turned out to be positive this past week.  She stopped taking any other medications that are unsafe in pregnancy.  She has not tried anything for her current symptom set.   No current facility-administered medications for this encounter.   Current Outpatient Medications:  .  acetaminophen (TYLENOL) 500 MG tablet, Take 1 tablet (500 mg total) by mouth every 4 (four) hours as needed for fever., Disp: 30 tablet, Rfl: 0    No Known Allergies   History reviewed. No pertinent past medical history.    History reviewed. No pertinent surgical history.   Review of Systems  Constitutional: Negative for fever and malaise/fatigue.  HENT: Negative for congestion, ear pain, sinus pain and sore throat.   Eyes: Negative for blurred vision, double vision, discharge and redness.  Respiratory: Negative for cough, hemoptysis, shortness of breath and wheezing.   Cardiovascular: Positive for chest pain.  Gastrointestinal: Positive for heartburn, nausea and vomiting. Negative for abdominal pain and diarrhea.  Genitourinary: Negative for dysuria, flank pain and hematuria.  Musculoskeletal: Negative for myalgias.  Skin: Negative for rash.  Neurological: Negative for dizziness, weakness and headaches.  Psychiatric/Behavioral: Negative for depression and substance abuse.    Objective:   Vitals: BP 123/69   Pulse 80   Temp 98.6 F (37 C)   Resp 18   LMP  (LMP Unknown)   SpO2 100%   Physical Exam Constitutional:      General: She is not in acute distress.    Appearance: Normal appearance. She is well-developed and normal  weight. She is not ill-appearing, toxic-appearing or diaphoretic.  HENT:     Head: Normocephalic and atraumatic.     Right Ear: External ear normal.     Left Ear: External ear normal.     Nose: Nose normal.     Mouth/Throat:     Mouth: Mucous membranes are moist.     Pharynx: Oropharynx is clear.  Eyes:     General: No scleral icterus.    Extraocular Movements: Extraocular movements intact.     Pupils: Pupils are equal, round, and reactive to light.  Cardiovascular:     Rate and Rhythm: Normal rate and regular rhythm.     Pulses: Normal pulses.     Heart sounds: Normal heart sounds. No murmur. No friction rub. No gallop.   Pulmonary:     Effort: Pulmonary effort is normal. No respiratory distress.     Breath sounds: Normal breath sounds. No stridor. No wheezing, rhonchi or rales.  Abdominal:     General: Bowel sounds are normal. There is no distension.     Palpations: Abdomen is soft. There is no mass.     Tenderness: There is no abdominal tenderness. There is no right CVA tenderness, left CVA tenderness, guarding or rebound.  Skin:    General: Skin is warm and dry.     Coloration: Skin is not pale.     Findings: No rash.  Neurological:     General: No focal deficit present.     Mental Status: She is alert and oriented to person, place, and  time.  Psychiatric:        Mood and Affect: Mood normal.        Behavior: Behavior normal.        Thought Content: Thought content normal.        Judgment: Judgment normal.     Results for orders placed or performed during the hospital encounter of 07/09/19 (from the past 24 hour(s))  Pregnancy, urine POC     Status: Abnormal   Collection Time: 07/09/19  7:39 PM  Result Value Ref Range   Preg Test, Ur POSITIVE (A) NEGATIVE    Assessment and Plan :   1. Atypical chest pain   2. Nausea and vomiting, intractability of vomiting not specified, unspecified vomiting type   3. Positive pregnancy test   4. First trimester pregnancy      I suspect that patient is having possible esophagitis related to persistent nausea and vomiting from her pregnancy.  Patient was given a GI cocktail without Donnatal in clinic with some relief.  Counseled that we will try to use a combination of pyridoxine and doxylamine.  She plans on establishing care with an obstetrician. Counseled patient on potential for adverse effects with medications prescribed/recommended today, strict ER (MAU) and return-to-clinic precautions discussed, patient verbalized understanding.  Encourage patient to start a prenatal vitamin.   Wallis BambergMani, Saylee Sherrill, PA-C 07/11/19 1221

## 2019-07-09 NOTE — ED Triage Notes (Signed)
Pt reports vomiting x 3 days. Pt reports she had a positive pregnancy test at [facility]. Pt reports burning to mid chest to epigastric area. Pt denies SHOB.

## 2019-07-09 NOTE — ED Triage Notes (Signed)
Pt c/o chest pain x2 days, pt states she doesn't know how far along she is with her pregnancy. Pt states she has pressure in her chest, has not had a period in 3 monthes but took several tests and none came back positive until last week. Pt also endorses abdominal cramping from time to time. Pt states shes been vomiting a whole lot.

## 2019-07-09 NOTE — Discharge Instructions (Signed)
If your nausea and vomiting get worse, then please go to the Maternal Admission Unit (MAU).

## 2019-07-10 ENCOUNTER — Emergency Department (HOSPITAL_COMMUNITY): Payer: Medicaid Other

## 2019-07-10 LAB — COMPREHENSIVE METABOLIC PANEL
ALT: 20 U/L (ref 0–44)
AST: 17 U/L (ref 15–41)
Albumin: 3.8 g/dL (ref 3.5–5.0)
Alkaline Phosphatase: 43 U/L (ref 38–126)
Anion gap: 8 (ref 5–15)
BUN: 12 mg/dL (ref 6–20)
CO2: 24 mmol/L (ref 22–32)
Calcium: 9.2 mg/dL (ref 8.9–10.3)
Chloride: 103 mmol/L (ref 98–111)
Creatinine, Ser: 0.89 mg/dL (ref 0.44–1.00)
GFR calc Af Amer: >60 mL/min (ref >60)
GFR calc non Af Amer: >60 mL/min (ref >60)
Glucose, Bld: 100 mg/dL — ABNORMAL HIGH (ref 70–99)
Potassium: 4.2 mmol/L (ref 3.5–5.1)
Sodium: 135 mmol/L (ref 135–145)
Total Bilirubin: 0.3 mg/dL (ref 0.3–1.2)
Total Protein: 6.5 g/dL (ref 6.5–8.1)

## 2019-07-10 LAB — HCG, QUANTITATIVE, PREGNANCY: hCG, Beta Chain, Quant, S: 83647 m[IU]/mL — ABNORMAL HIGH (ref <5)

## 2019-07-10 LAB — URINALYSIS, ROUTINE W REFLEX MICROSCOPIC
Bilirubin Urine: NEGATIVE
Glucose, UA: NEGATIVE mg/dL
Hgb urine dipstick: NEGATIVE
Ketones, ur: NEGATIVE mg/dL
Leukocytes,Ua: NEGATIVE
Nitrite: NEGATIVE
Protein, ur: NEGATIVE mg/dL
Specific Gravity, Urine: 1.02 (ref 1.005–1.030)
pH: 7 (ref 5.0–8.0)

## 2019-07-10 LAB — CBC
HCT: 36.3 % (ref 36.0–46.0)
Hemoglobin: 11.7 g/dL — ABNORMAL LOW (ref 12.0–15.0)
MCH: 27.2 pg (ref 26.0–34.0)
MCHC: 32.2 g/dL (ref 30.0–36.0)
MCV: 84.4 fL (ref 80.0–100.0)
Platelets: 283 10*3/uL (ref 150–400)
RBC: 4.3 MIL/uL (ref 3.87–5.11)
RDW: 12.6 % (ref 11.5–15.5)
WBC: 8.6 10*3/uL (ref 4.0–10.5)
nRBC: 0 % (ref 0.0–0.2)

## 2019-07-10 LAB — I-STAT BETA HCG BLOOD, ED (MC, WL, AP ONLY): I-stat hCG, quantitative: >2000 m[IU]/mL — ABNORMAL HIGH (ref <5)

## 2019-07-10 LAB — LIPASE, BLOOD: Lipase: 23 U/L (ref 11–51)

## 2019-07-10 MED ORDER — ONDANSETRON 4 MG PO TBDP
4.0000 mg | ORAL_TABLET | Freq: Once | ORAL | Status: AC
  Administered 2019-07-10: 4 mg via ORAL
  Filled 2019-07-10: qty 1

## 2019-07-10 MED ORDER — ALUM & MAG HYDROXIDE-SIMETH 200-200-20 MG/5ML PO SUSP
30.0000 mL | Freq: Once | ORAL | Status: AC
  Administered 2019-07-10: 03:00:00 30 mL via ORAL
  Filled 2019-07-10: qty 30

## 2019-07-10 MED ORDER — SODIUM CHLORIDE 0.9 % IV BOLUS (SEPSIS)
1000.0000 mL | Freq: Once | INTRAVENOUS | Status: AC
  Administered 2019-07-10: 03:00:00 1000 mL via INTRAVENOUS

## 2019-07-10 MED ORDER — METOCLOPRAMIDE HCL 5 MG/ML IJ SOLN
10.0000 mg | Freq: Once | INTRAMUSCULAR | Status: AC
  Administered 2019-07-10: 03:00:00 10 mg via INTRAVENOUS
  Filled 2019-07-10: qty 2

## 2019-07-10 MED ORDER — METOCLOPRAMIDE HCL 10 MG PO TABS: 10.0000 mg | ORAL_TABLET | Freq: Four times a day (QID) | ORAL | 0 refills | Status: DC | PRN

## 2019-07-10 MED ORDER — ONDANSETRON HCL 4 MG PO TABS: 4.0000 mg | ORAL_TABLET | Freq: Once | ORAL | Status: DC

## 2019-07-10 NOTE — ED Notes (Signed)
Pt eating and drinking meal from [employer] at this time.

## 2019-07-10 NOTE — ED Provider Notes (Signed)
TIME SEEN: 1:59 AM  CHIEF COMPLAINT: Chest pain, vomiting  HPI: Patient is a 23 year old female who is a G1, P0 who presents emergency department with burning chest pain that is worse after eating and after vomiting.  She just recently found out she is pregnant.  States she is unsure of her last menstrual cycle.  States she has gone for 3 months without a menstrual cycle but only recently had a positive pregnancy test.  She states she has been vomiting for the past several days.  No fevers.  No abdominal pain.  No vaginal discharge, bleeding.  No pain with urination.  No diarrhea.  Reports she has been constipated.  Has not tried any medications at home.  Given Zofran in the waiting room and states that this improved her symptoms but now her nausea is coming back.  ROS: See HPI Constitutional: no fever  Eyes: no drainage  ENT: no runny nose   Cardiovascular:  no chest pain  Resp: no SOB  GI:  vomiting GU: no dysuria Integumentary: no rash  Allergy: no hives  Musculoskeletal: no leg swelling  Neurological: no slurred speech ROS otherwise negative  PAST MEDICAL HISTORY/PAST SURGICAL HISTORY:  No past medical history on file.  MEDICATIONS:  Prior to Admission medications   Medication Sig Start Date End Date Taking? Authorizing Provider  acetaminophen (TYLENOL) 500 MG tablet Take 1 tablet (500 mg total) by mouth every 4 (four) hours as needed for fever. 01/31/17   Antonietta Breach, PA-C  doxylamine, Sleep, (UNISOM) 25 MG tablet Take 1 tablet (25 mg total) by mouth at bedtime as needed. 07/09/19   Jaynee Eagles, PA-C  vitamin B-6 (PYRIDOXINE) 25 MG tablet Take 1 tablet (25 mg total) by mouth 2 (two) times daily as needed. 07/09/19   Jaynee Eagles, PA-C  medroxyPROGESTERone (DEPO-PROVERA) 150 MG/ML injection Inject 150 mg into the muscle every 3 (three) months.  07/09/19  [provider]  promethazine (PHENERGAN) 25 MG tablet Take 1 tablet (25 mg total) by mouth every 6 (six) hours as needed for  nausea or vomiting. 01/31/17 07/09/19  Antonietta Breach, PA-C    ALLERGIES:  No Known Allergies  SOCIAL HISTORY:  Social History   Tobacco Use  . Smoking status: Never Smoker  . Smokeless tobacco: Never Used  Substance Use Topics  . Alcohol use: No    FAMILY HISTORY: No family history on file.  EXAM: BP (!) 129/111 (BP Location: Right Arm)   Pulse 87   Temp 98.3 F (36.8 C) (Oral)   Resp 20   LMP  (LMP Unknown)   SpO2 100%  CONSTITUTIONAL: Alert and oriented and responds appropriately to questions. Well-appearing; well-nourished HEAD: Normocephalic EYES: Conjunctivae clear, pupils appear equal, EOMI ENT: normal nose; moist mucous membranes NECK: Supple, no meningismus, no nuchal rigidity, no LAD  CARD: RRR; S1 and S2 appreciated; no murmurs, no clicks, no rubs, no gallops RESP: Normal chest excursion without splinting or tachypnea; breath sounds clear and equal bilaterally; no wheezes, no rhonchi, no rales, no hypoxia or respiratory distress, speaking full sentences ABD/GI: Normal bowel sounds; non-distended; soft, non-tender, no rebound, no guarding, no peritoneal signs, no hepatosplenomegaly BACK:  The back appears normal and is non-tender to palpation, there is no CVA tenderness EXT: Normal ROM in all joints; non-tender to palpation; no edema; normal capillary refill; no cyanosis, no calf tenderness or swelling    SKIN: Normal color for age and race; warm; no rash NEURO: Moves all extremities equally PSYCH: The patient's mood  and manner are appropriate. Grooming and personal hygiene are appropriate.  MEDICAL DECISION MAKING: Patient here with nausea, vomiting and symptoms of acid reflux.  Her work-up in triage is unremarkable.  Urine shows no sign of infection or ketones.  She was initially hypertensive but on recheck this is normal with a blood pressure of 119/72.  She reports she has follow-up appointment with OB/GYN scheduled in 2 days.  Will give IV fluids, Reglan, Mylanta  for symptomatic relief.  Doubt ACS, PE, dissection.  Chest x-ray obtained in triage is unremarkable and shows no pulmonary edema, cardiomegaly, widened mediastinum, infiltrate, pneumothorax.  ED PROGRESS: Patient's hCG is over 83,000.  She reports feeling better after medications and has been able to tolerate fluids.  I feel she is safe to be discharged home.  Will discharge with prescriptions of Reglan and recommended over-the-counter medications for acid reflux as well as constipation.  She has follow-up with her OB/GYN in 2 days.  At this time, I do not feel there is any life-threatening condition present. I have reviewed and discussed all results (EKG, imaging, lab, urine as appropriate) and exam findings with patient/family. I have reviewed nursing notes and appropriate previous records.  I feel the patient is safe to be discharged home without further emergent workup and can continue workup as an outpatient as needed. Discussed usual and customary return precautions. Patient/family verbalize understanding and are comfortable with this plan.  Outpatient follow-up has been provided as needed. All questions have been answered.      EKG Interpretation  Date/Time:  Saturday July 10 2019 02:06:06 EDT Ventricular Rate:  74 PR Interval:    QRS Duration: 79 QT Interval:  344 QTC Calculation: 382 R Axis:   82 Text Interpretation:  Sinus rhythm No old tracing to compare Confirmed by Chancellor Vanderloop, Baxter HireKristen [phone]([id number]) on 07/10/2019 2:08:29 AM         Ammanda Dobbins, Layla MawKristen N, DO 07/10/19 [id number]

## 2019-07-10 NOTE — Discharge Instructions (Signed)
Please avoid NSAIDs such as aspirin (Goody powders), ibuprofen (Motrin, Advil), naproxen (Aleve) as these may worsen your symptoms and these medications cannot be taken in pregnancy.  Tylenol 1000 mg every 6 hours as needed for pain is safe to take in pregnancy.  Please avoid spicy, acidic (citrus fruits, tomato based sauces, salsa), greasy, fatty foods.  Please avoid caffeine and alcohol.    Please continue your prenatal vitamins.  You may use MiraLAX and Colace over-the-counter twice daily as needed for constipation.  These medications are safe in pregnancy.  You may use over-the-counter Tums, Mylanta or Maalox, Pepcid as needed for heartburn.  These medications are safe in pregnancy.  Please follow-up with your OB/GYN on Monday as scheduled.

## 2019-07-11 ENCOUNTER — Encounter (HOSPITAL_COMMUNITY): Payer: Self-pay | Admitting: Urgent Care

## 2019-07-12 ENCOUNTER — Encounter: Payer: Self-pay | Admitting: Obstetrics & Gynecology

## 2019-07-12 ENCOUNTER — Other Ambulatory Visit: Payer: Self-pay

## 2019-07-12 ENCOUNTER — Ambulatory Visit (INDEPENDENT_AMBULATORY_CARE_PROVIDER_SITE_OTHER): Payer: Self-pay

## 2019-07-12 DIAGNOSIS — Z3201 Encounter for pregnancy test, result positive: Secondary | ICD-10-CM

## 2019-07-12 NOTE — Progress Notes (Addendum)
Pt here today for pregnancy test.  Per chart review pt had a beta and urine pregnancy test on 07/10/19.  Due to tested two days ago did not do another pregnancy test today.   Pt reports LMP 03/25/19 EDD 12/30/19.  Medications reconciled.  Pt denies any pain or bleedind today.  List of medications safe to take in pregnancy given to pt.  Proof of pregnancy letter provided by the front office to start prenatal care.    Mel Almond, RN 07/12/19   Chart reviewed for nurse visit. Agree with plan of care.   Virginia Rochester, NP 07/12/2019 2:34 PM

## 2019-07-13 ENCOUNTER — Telehealth: Payer: Self-pay | Admitting: Family Medicine

## 2019-07-13 NOTE — Telephone Encounter (Signed)
Attempted to call patient about her appointment on 7/22 @ 10:30. No answer, voicemail was left instructing patient that the visit is a telephone visit and she does not have to come to the office. Patient instructed to be available around her appointment time. Patient instructed to give the office a call back if she is needing to reschedule.

## 2019-07-14 ENCOUNTER — Ambulatory Visit (INDEPENDENT_AMBULATORY_CARE_PROVIDER_SITE_OTHER): Payer: Self-pay

## 2019-07-14 ENCOUNTER — Other Ambulatory Visit: Payer: Self-pay

## 2019-07-14 DIAGNOSIS — Z3490 Encounter for supervision of normal pregnancy, unspecified, unspecified trimester: Secondary | ICD-10-CM | POA: Insufficient documentation

## 2019-07-14 DIAGNOSIS — Z3400 Encounter for supervision of normal first pregnancy, unspecified trimester: Secondary | ICD-10-CM

## 2019-07-14 MED ORDER — AMBULATORY NON FORMULARY MEDICATION: 1.0000 | 0 refills | Status: DC | PRN

## 2019-07-14 MED ORDER — PREPLUS 27-1 MG PO TABS: 1.0000 | ORAL_TABLET | Freq: Every day | ORAL | 6 refills | Status: AC

## 2019-07-14 NOTE — Addendum Note (Signed)
Addended by: Michel Harrow on: 07/14/2019 04:46 PM   Modules accepted: Orders, Level of Service

## 2019-07-14 NOTE — Progress Notes (Signed)
I connected with  Suzan Garibaldi on 07/14/19 at 10:30 AM EDT by telephone and verified that I am speaking with the correct person using two identifiers.   I discussed the limitations, risks, security and privacy concerns of performing an evaluation and management service by telephone and the availability of in person appointments. I also discussed with the patient that there may be a patient responsible charge related to this service. The patient expressed understanding and agreed to proceed.  Pt hx obtained.  Babyscripts downloaded on pt's phone. Pt able to place BP value successfully in app.  Pt informed that she will receive a BP cuff to her home and if she does not receive the cuff within the week to please give the office a call. I informed pt that she will need to get undressed for a pap smear, she will be offered genetic screening, OB panel, OB urine cx will be obtained as well.  I also scheduled anatomy scan for 08/03/19 @ 1100.  Pt verbalized understanding and did not have any other questions.     Verdell Carmine, RN 07/14/2019  10:43 AM

## 2019-07-15 ENCOUNTER — Ambulatory Visit (INDEPENDENT_AMBULATORY_CARE_PROVIDER_SITE_OTHER): Payer: Self-pay | Admitting: Obstetrics and Gynecology

## 2019-07-15 ENCOUNTER — Encounter: Payer: Self-pay | Admitting: Obstetrics and Gynecology

## 2019-07-15 ENCOUNTER — Other Ambulatory Visit (HOSPITAL_COMMUNITY)
Admission: RE | Admit: 2019-07-15 | Discharge: 2019-07-15 | Disposition: A | Discharge status: Home / Self Care | Payer: Medicaid Other | Source: Ambulatory Visit | Attending: Obstetrics and Gynecology | Admitting: Obstetrics and Gynecology

## 2019-07-15 ENCOUNTER — Other Ambulatory Visit: Payer: Self-pay

## 2019-07-15 VITALS — BP 107/72 | HR 89 | Wt 140.8 lb

## 2019-07-15 DIAGNOSIS — Z3A16 16 weeks gestation of pregnancy: Secondary | ICD-10-CM

## 2019-07-15 DIAGNOSIS — Z3400 Encounter for supervision of normal first pregnancy, unspecified trimester: Secondary | ICD-10-CM

## 2019-07-15 DIAGNOSIS — Z3490 Encounter for supervision of normal pregnancy, unspecified, unspecified trimester: Secondary | ICD-10-CM

## 2019-07-15 DIAGNOSIS — Z3492 Encounter for supervision of normal pregnancy, unspecified, second trimester: Secondary | ICD-10-CM

## 2019-07-15 NOTE — Patient Instructions (Signed)

## 2019-07-15 NOTE — Progress Notes (Signed)
History:   Burtis Juneslexis Longest is a 23 y.o. G1P0 at 5827w0d by LMP being seen today for her first obstetrical visit.  Her obstetrical history is significant for healthy female, nausea.  Patient does intend to breast feed. Pregnancy history fully reviewed. Planned pregnancy, FOB involved.   Patient reports no complaints.  HISTORY: OB History  Gravida Para Term Preterm AB Living  1 0 0 0 0 0  SAB TAB Ectopic Multiple Live Births  0 0 0 0 0    # Outcome Date GA Lbr Len/2nd Weight Sex Delivery Anes PTL Lv  1 Current             Last pap smear was done 07/15/2019  No past medical history on file. No past surgical history on file. Family History  Problem Relation Age of Onset  . Stroke Father    Social History   Tobacco Use  . Smoking status: Never Smoker  . Smokeless tobacco: Never Used  Substance Use Topics  . Alcohol use: Not Currently    Comment: social drinking  . Drug use: No   No Known Allergies Current Outpatient Medications on File Prior to Visit  Medication Sig Dispense Refill  . acetaminophen (TYLENOL) 500 MG tablet Take 1 tablet (500 mg total) by mouth every 4 (four) hours as needed for fever. (Patient not taking: Reported on 07/12/2019) 30 tablet 0  . AMBULATORY NON FORMULARY MEDICATION 1 Device by Other route as needed. BP cuff- Regular size Monitored regularly at home ICD 10: Z34.90 1 Device 0  . doxylamine, Sleep, (UNISOM) 25 MG tablet Take 1 tablet (25 mg total) by mouth at bedtime as needed. (Patient not taking: Reported on 07/14/2019) 30 tablet 0  . metoCLOPramide (REGLAN) 10 MG tablet Take 1 tablet (10 mg total) by mouth every 6 (six) hours as needed for nausea. 20 tablet 0  . Prenatal Vit-Fe Fumarate-FA (PREPLUS) 27-1 MG TABS Take 1 tablet by mouth daily. 30 tablet 6  . vitamin B-6 (PYRIDOXINE) 25 MG tablet Take 1 tablet (25 mg total) by mouth 2 (two) times daily as needed. 30 tablet 0  . [DISCONTINUED] medroxyPROGESTERone (DEPO-PROVERA) 150 MG/ML injection Inject  150 mg into the muscle every 3 (three) months.    . [DISCONTINUED] promethazine (PHENERGAN) 25 MG tablet Take 1 tablet (25 mg total) by mouth every 6 (six) hours as needed for nausea or vomiting. 12 tablet 0   No current facility-administered medications on file prior to visit.     Review of Systems Pertinent items noted in HPI and remainder of comprehensive ROS otherwise negative. Physical Exam:   Vitals:   07/15/19 0953  BP: 107/72  Pulse: 89  Weight: 140 lb 12.8 oz (63.9 kg)   BP 107/72   Pulse 89   Wt 140 lb 12.8 oz (63.9 kg)   LMP 03/25/2019 (Exact Date)   BMI 29.43 kg/m  Uterine Size: size equals dates  Pelvic Exam:    Perineum: No Hemorrhoids, Normal Perineum   Vulva: normal   Vagina:  normal mucosa, normal discharge, no palpable nodules   pH: Not done   Cervix: no bleeding following Pap, no cervical motion tenderness and no lesions   Adnexa: normal adnexa and no mass, fullness, tenderness   Bony Pelvis: Adequate  System: Breast:  No nipple retraction or dimpling, No nipple discharge or bleeding, No axillary or supraclavicular adenopathy, Normal to palpation without dominant masses   Skin: normal coloration and turgor, no rashes    Neurologic: negative  Extremities: normal strength, tone, and muscle mass   HEENT neck supple with midline trachea and thyroid without masses   Mouth/Teeth mucous membranes moist, pharynx normal without lesions   Neck supple and no masses   Cardiovascular: regular rate and rhythm, no murmurs or gallops   Respiratory:  appears well, vitals normal, no respiratory distress, acyanotic, normal RR, neck free of mass or lymphadenopathy, chest clear, no wheezing, crepitations, rhonchi, normal symmetric air entry   Abdomen: soft, non-tender; bowel sounds normal; no masses,  no organomegaly   Urinary: urethral meatus normal   Assessment:    Pregnancy: G1P0 Patient Active Problem List   Diagnosis Date Noted  . Supervision of low-risk pregnancy  07/14/2019     Plan:    1. Encounter for supervision of low-risk first pregnancy, antepartum  - Genetic Screening - Obstetric Panel, Including HIV - Culture, OB Urine - Cytology - PAP( [place]) - + fetal heart rate via doppler.  - patient has BP cuff at home.    Initial labs drawn. Continue prenatal vitamins. Genetic Screening discussed, NIPS: requested. Ultrasound discussed; fetal anatomic survey: ordered. Problem list reviewed and updated. The nature of [place] with multiple MDs and other Advanced Practice Providers was explained to patient; also emphasized that residents, students are part of our team. Routine obstetric precautions reviewed. Return in about 4 weeks (around 08/12/2019) for For virtual visit .     Demesha Boorman, Artist Pais, [place] for [facility], [place]

## 2019-07-17 LAB — OBSTETRIC PANEL, INCLUDING HIV
Antibody Screen: NEGATIVE
Basophils Absolute: 0.1 10*3/uL (ref 0.0–0.2)
Basos: 1 %
EOS (ABSOLUTE): 0.3 10*3/uL (ref 0.0–0.4)
Eos: 4 %
HIV Screen 4th Generation wRfx: NONREACTIVE
Hematocrit: 32.9 % — ABNORMAL LOW (ref 34.0–46.6)
Hemoglobin: 11.5 g/dL (ref 11.1–15.9)
Hepatitis B Surface Ag: NEGATIVE
Immature Grans (Abs): 0 10*3/uL (ref 0.0–0.1)
Immature Granulocytes: 0 %
Lymphocytes Absolute: 1.9 10*3/uL (ref 0.7–3.1)
Lymphs: 25 %
MCH: 27.8 pg (ref 26.6–33.0)
MCHC: 35 g/dL (ref 31.5–35.7)
MCV: 80 fL (ref 79–97)
Monocytes Absolute: 0.5 10*3/uL (ref 0.1–0.9)
Monocytes: 6 %
Neutrophils Absolute: 4.7 10*3/uL (ref 1.4–7.0)
Neutrophils: 64 %
Platelets: 322 10*3/uL (ref 150–450)
RBC: 4.14 x10E6/uL (ref 3.77–5.28)
RDW: 12 % (ref 11.7–15.4)
RPR Ser Ql: NONREACTIVE
Rh Factor: POSITIVE
Rubella Antibodies, IGG: 10.8 index (ref >0.99)
WBC: 7.3 10*3/uL (ref 3.4–10.8)

## 2019-07-17 LAB — HEMOGLOBINOPATHY EVALUATION
HGB C: 0 %
HGB S: 0 %
HGB VARIANT: 0 %
Hemoglobin A2 Quantitation: 2.1 % (ref 1.8–3.2)
Hemoglobin F Quantitation: 0 % (ref 0.0–2.0)
Hgb A: 97.9 % (ref 96.4–98.8)

## 2019-07-22 LAB — CYTOLOGY - PAP
Chlamydia: NEGATIVE
Diagnosis: NEGATIVE
Neisseria Gonorrhea: NEGATIVE

## 2019-07-23 ENCOUNTER — Encounter: Payer: Self-pay | Admitting: *Deleted

## 2019-07-26 ENCOUNTER — Other Ambulatory Visit: Payer: Self-pay

## 2019-07-26 DIAGNOSIS — R110 Nausea: Secondary | ICD-10-CM

## 2019-07-26 MED ORDER — PROMETHAZINE HCL 25 MG PO TABS: 25.0000 mg | ORAL_TABLET | Freq: Four times a day (QID) | ORAL | 1 refills | Status: DC | PRN

## 2019-07-28 ENCOUNTER — Encounter: Payer: Self-pay | Admitting: *Deleted

## 2019-08-03 ENCOUNTER — Other Ambulatory Visit: Payer: Self-pay | Admitting: Obstetrics and Gynecology

## 2019-08-03 ENCOUNTER — Ambulatory Visit (HOSPITAL_COMMUNITY)
Admission: RE | Admit: 2019-08-03 | Discharge: 2019-08-03 | Disposition: A | Discharge status: Home / Self Care | Payer: Medicaid Other | Source: Ambulatory Visit | Attending: Obstetrics and Gynecology | Admitting: Obstetrics and Gynecology

## 2019-08-03 ENCOUNTER — Other Ambulatory Visit: Payer: Self-pay

## 2019-08-03 DIAGNOSIS — Z3400 Encounter for supervision of normal first pregnancy, unspecified trimester: Secondary | ICD-10-CM | POA: Insufficient documentation

## 2019-08-03 DIAGNOSIS — Z3A09 9 weeks gestation of pregnancy: Secondary | ICD-10-CM

## 2019-08-03 DIAGNOSIS — Z3687 Encounter for antenatal screening for uncertain dates: Secondary | ICD-10-CM

## 2019-08-03 DIAGNOSIS — Z363 Encounter for antenatal screening for malformations: Secondary | ICD-10-CM

## 2019-08-11 ENCOUNTER — Telehealth: Payer: Self-pay | Admitting: Obstetrics and Gynecology

## 2019-08-11 NOTE — Telephone Encounter (Signed)
Called the patient to inform of the mychart visit. Informed of logging 15 minutes prior to the appointment. If no one has logged in at least 15 minutes after the appointment time please contact our office to be sure there isn't an connection issue or verify if the doctor is running behind. °

## 2019-08-12 ENCOUNTER — Telehealth (INDEPENDENT_AMBULATORY_CARE_PROVIDER_SITE_OTHER): Payer: Self-pay | Admitting: Obstetrics and Gynecology

## 2019-08-12 ENCOUNTER — Telehealth: Payer: Self-pay | Admitting: Emergency Medicine

## 2019-08-12 DIAGNOSIS — Z3A11 11 weeks gestation of pregnancy: Secondary | ICD-10-CM

## 2019-08-12 DIAGNOSIS — Z3491 Encounter for supervision of normal pregnancy, unspecified, first trimester: Secondary | ICD-10-CM

## 2019-08-12 DIAGNOSIS — O219 Vomiting of pregnancy, unspecified: Secondary | ICD-10-CM | POA: Insufficient documentation

## 2019-08-12 MED ORDER — BLOOD PRESSURE KIT DEVI: 1.0000 | Freq: Every day | 0 refills | Status: DC

## 2019-08-12 MED ORDER — FAMOTIDINE 20 MG PO TABS: 20.0000 mg | ORAL_TABLET | Freq: Two times a day (BID) | ORAL | 1 refills | Status: DC

## 2019-08-12 MED ORDER — ONDANSETRON 8 MG PO TBDP: 8.0000 mg | ORAL_TABLET | Freq: Three times a day (TID) | ORAL | 1 refills | Status: DC | PRN

## 2019-08-12 NOTE — Progress Notes (Signed)
   TELEHEALTH VIRTUAL OBSTETRICS VISIT ENCOUNTER NOTE  I connected with Christina Velazquez on 08/12/19 at  9:35 AM EDT by telephone at home and verified that I am speaking with the correct person using two identifiers.   I discussed the limitations, risks, security and privacy concerns of performing an evaluation and management service by telephone and the availability of in person appointments. I also discussed with the patient that there may be a patient responsible charge related to this service. The patient expressed understanding and agreed to proceed.  Subjective:  Christina Velazquez is a 23 y.o. G1P0 at [redacted]w[redacted]d being followed for ongoing prenatal care.  She is currently monitored for the following issues for this low-risk pregnancy and has Supervision of low-risk pregnancy and Nausea and vomiting in pregnancy on their problem list.  Patient reports nausea and vomiting. Reports fetal movement. Denies any contractions, bleeding or leaking of fluid.   The following portions of the patient's history were reviewed and updated as appropriate: allergies, current medications, past family history, past medical history, past social history, past surgical history and problem list.   Objective:   General:  Alert, oriented and cooperative.   Mental Status: Normal mood and affect perceived. Normal judgment and thought content.  Rest of physical exam deferred due to type of encounter  Assessment and Plan:  Pregnancy: G1P0 at [redacted]w[redacted]d  1. Encounter for supervision of low-risk pregnancy in first trimester  Doing well [place] scheduled Discussed low fetal fraction on genetic screen. Will contract [employer] to see if re-testing is needed. Per Johnsie Cancel re-testing is not needed. The patient was informed of this.  Patient has not received her BP cuff. Called pharmacy and get update and will contact patient.   2. Nausea and vomiting in pregnancy  RX: Zofran and pepcid   Preterm labor symptoms and general obstetric  precautions including but not limited to vaginal bleeding, contractions, leaking of fluid and fetal movement were reviewed in detail with the patient.  I discussed the assessment and treatment plan with the patient. The patient was provided an opportunity to ask questions and all were answered. The patient agreed with the plan and demonstrated an understanding of the instructions. The patient was advised to call back or seek an in-person office evaluation/go to [facility] at [facility] for any urgent or concerning symptoms. Please refer to After Visit Summary for other counseling recommendations.   I provided 12 minutes of non-face-to-face time during this encounter.  Return in about 4 weeks (around 09/09/2019) for virtual ok .  Future Appointments  Date Time Provider [place]  10/07/2019  9:30 AM [facility] [place], NP [facility] for [facility], [place]

## 2019-08-12 NOTE — Telephone Encounter (Signed)
Received a prior authorization for prescription for zofran 8mg . Called pharmacy to discuss and was informed that pt used a discount card and picked up the medication since it was not covered by Medicaid-Family planning.

## 2019-08-12 NOTE — Progress Notes (Addendum)
I connected with  Suzan Garibaldi on 08/12/19 at  9:35 AM EDT by telephone and verified that I am speaking with the correct person using two identifiers.   I discussed the limitations, risks, security and privacy concerns of performing an evaluation and management service by telephone and the availability of in person appointments. I also discussed with the patient that there may be a patient responsible charge related to this service. The patient expressed understanding and agreed to proceed.  Spickard, In[place] 08/12/2019  9:28 AM   Patients blood pressure cuff was entered incorrectly fixed the pharmacy and contacted the patient to advise that [place] will call and confirm that she gets it and if she doesn't get it to call and advise the office and we will follow up on it.

## 2019-08-12 NOTE — Addendum Note (Signed)
Addended by: Alric Seton on: 08/12/2019 02:24 PM   Modules accepted: Orders

## 2019-08-13 MED ORDER — BLOOD PRESSURE KIT DEVI: 1.0000 | Freq: Every day | 0 refills | Status: DC

## 2019-08-13 NOTE — Addendum Note (Signed)
Addended by: Alric Seton on: 08/13/2019 08:36 AM   Modules accepted: Orders

## 2019-08-13 NOTE — Addendum Note (Signed)
Addended by: Alric Seton on: 08/13/2019 08:37 AM   Modules accepted: Orders

## 2019-10-07 ENCOUNTER — Ambulatory Visit (HOSPITAL_COMMUNITY)
Admission: RE | Admit: 2019-10-07 | Discharge: 2019-10-07 | Disposition: A | Discharge status: Home / Self Care | Payer: Medicaid Other | Source: Ambulatory Visit | Attending: Obstetrics and Gynecology | Admitting: Obstetrics and Gynecology

## 2019-10-07 ENCOUNTER — Other Ambulatory Visit: Payer: Self-pay

## 2019-10-07 DIAGNOSIS — Z3491 Encounter for supervision of normal pregnancy, unspecified, first trimester: Secondary | ICD-10-CM | POA: Diagnosis not present

## 2019-10-07 DIAGNOSIS — Z3A19 19 weeks gestation of pregnancy: Secondary | ICD-10-CM

## 2019-10-07 DIAGNOSIS — Z363 Encounter for antenatal screening for malformations: Secondary | ICD-10-CM

## 2019-10-14 ENCOUNTER — Ambulatory Visit (INDEPENDENT_AMBULATORY_CARE_PROVIDER_SITE_OTHER): Payer: Self-pay | Admitting: Obstetrics and Gynecology

## 2019-10-14 ENCOUNTER — Other Ambulatory Visit: Payer: Self-pay

## 2019-10-14 VITALS — BP 119/79 | HR 88 | Wt 134.0 lb

## 2019-10-14 DIAGNOSIS — Z3491 Encounter for supervision of normal pregnancy, unspecified, first trimester: Secondary | ICD-10-CM

## 2019-10-14 DIAGNOSIS — Z3A20 20 weeks gestation of pregnancy: Secondary | ICD-10-CM

## 2019-10-14 DIAGNOSIS — Z3492 Encounter for supervision of normal pregnancy, unspecified, second trimester: Secondary | ICD-10-CM

## 2019-10-14 MED ORDER — BLOOD PRESSURE KIT DEVI: 1.0000 | Freq: Every day | 0 refills | Status: AC

## 2019-10-14 NOTE — Progress Notes (Signed)
No bp cuff ordered one today [place]

## 2019-10-14 NOTE — Progress Notes (Signed)
   PRENATAL VISIT NOTE  Subjective:  Christina Velazquez is a 22 y.o. G1P0 at [redacted]w[redacted]d being seen today for ongoing prenatal care.  She is currently monitored for the following issues for this low-risk pregnancy and has Supervision of low-risk pregnancy and Nausea and vomiting in pregnancy on their problem list.  Patient reports no complaints.  Contractions: Not present. Vag. Bleeding: None.  Movement: Present. Denies leaking of fluid.   The following portions of the patient's history were reviewed and updated as appropriate: allergies, current medications, past family history, past medical history, past social history, past surgical history and problem list.   Objective:   Vitals:   10/14/19 0942  BP: 119/79  Pulse: 88  Weight: 134 lb (60.8 kg)    Fetal Status: Fetal Heart Rate (bpm): 152 Fundal Height: 20 cm Movement: Present     General:  Alert, oriented and cooperative. Patient is in no acute distress.  Skin: Skin is warm and dry. No rash noted.   Cardiovascular: Normal heart rate noted  Respiratory: Normal respiratory effort, no problems with respiration noted  Abdomen: Soft, gravid, appropriate for gestational age.  Pain/Pressure: Absent     Pelvic: Cervical exam deferred        Extremities: Normal range of motion.  Edema: None  Mental Status: Normal mood and affect. Normal behavior. Normal judgment and thought content.   Assessment and Plan:  Pregnancy: G1P0 at [redacted]w[redacted]d 1. Encounter for supervision of low-risk pregnancy in first trimester  - AFP, Serum, Open Spina Bifida - Patient does not have BP cuff. Will order one today.  - Nausea significantly improved.   Preterm labor symptoms and general obstetric precautions including but not limited to vaginal bleeding, contractions, leaking of fluid and fetal movement were reviewed in detail with the patient. Please refer to After Visit Summary for other counseling recommendations.   No follow-ups on file.  Future Appointments  Date  Time Provider [place]  11/11/2019 10:55 AM Rasch, Artist Pais, NP [place], NP

## 2019-10-16 LAB — AFP, SERUM, OPEN SPINA BIFIDA
AFP MoM: 0.84
AFP Value: 56.4 ng/mL
Gest. Age on Collection Date: 20 weeks
Maternal Age At EDD: 23.2 yr
OSBR Risk 1 IN: 10000
Test Results:: NEGATIVE
Weight: 134 [lb_av]

## 2019-11-10 ENCOUNTER — Telehealth: Payer: Self-pay | Admitting: Obstetrics and Gynecology

## 2019-11-10 NOTE — Telephone Encounter (Signed)
Spoke to patient about her appointment on 11/19 @ 10:35. Patient instructed to wear a face mask for the entire appointment and no visitors are allowed with her during the visit. Patient screened for covid symptoms and denied having any °

## 2019-11-11 ENCOUNTER — Ambulatory Visit (INDEPENDENT_AMBULATORY_CARE_PROVIDER_SITE_OTHER): Payer: Self-pay | Admitting: Obstetrics and Gynecology

## 2019-11-11 ENCOUNTER — Other Ambulatory Visit: Payer: Self-pay

## 2019-11-11 VITALS — BP 111/73 | HR 84 | Wt 133.3 lb

## 2019-11-11 DIAGNOSIS — Z3A24 24 weeks gestation of pregnancy: Secondary | ICD-10-CM

## 2019-11-11 DIAGNOSIS — Z3491 Encounter for supervision of normal pregnancy, unspecified, first trimester: Secondary | ICD-10-CM

## 2019-11-11 DIAGNOSIS — Z23 Encounter for immunization: Secondary | ICD-10-CM

## 2019-11-11 DIAGNOSIS — O219 Vomiting of pregnancy, unspecified: Secondary | ICD-10-CM

## 2019-11-11 NOTE — Patient Instructions (Addendum)
https://www.cdc.gov/vaccines/hcp/vis/vis-statements/tdap.pdf">  Tdap Vaccine (Tetanus, Diphtheria and Pertussis): What You Need to Know 1. Why get vaccinated? Tetanus, diphtheria and pertussis are very serious diseases. Tdap vaccine can protect us from these diseases. And, Tdap vaccine given to pregnant women can protect newborn babies against pertussis.. TETANUS (Lockjaw) is rare in the [place] today. It causes painful muscle tightening and stiffness, usually all over the body.  It can lead to tightening of muscles in the head and neck so you can't open your mouth, swallow, or sometimes even breathe. Tetanus kills about 1 out of 10 people who are infected even after receiving the best medical care. DIPHTHERIA is also rare in the [place] today. It can cause a thick coating to form in the back of the throat.  It can lead to breathing problems, heart failure, paralysis, and death. PERTUSSIS (Whooping Cough) causes severe coughing spells, which can cause difficulty breathing, vomiting and disturbed sleep.  It can also lead to weight loss, incontinence, and rib fractures. Up to 2 in 100 adolescents and 5 in 100 adults with pertussis are hospitalized or have complications, which could include pneumonia or death. These diseases are caused by bacteria. Diphtheria and pertussis are spread from person to person through secretions from coughing or sneezing. Tetanus enters the body through cuts, scratches, or wounds. Before vaccines, as many as 200,000 cases of diphtheria, 200,000 cases of pertussis, and hundreds of cases of tetanus, were reported in the [place] each year. Since vaccination began, reports of cases for tetanus and diphtheria have dropped by about 99% and for pertussis by about 80%. 2. Tdap vaccine Tdap vaccine can protect adolescents and adults from tetanus, diphtheria, and pertussis. One dose of Tdap is routinely given at age 11 or 12. People who did not get Tdap at that age  should get it as soon as possible. Tdap is especially important for healthcare professionals and anyone having close contact with a baby younger than 12 months. Pregnant women should get a dose of Tdap during every pregnancy, to protect the newborn from pertussis. Infants are most at risk for severe, life-threatening complications from pertussis. Another vaccine, called Td, protects against tetanus and diphtheria, but not pertussis. A Td booster should be given every 10 years. Tdap may be given as one of these boosters if you have never gotten Tdap before. Tdap may also be given after a severe cut or burn to prevent tetanus infection. Your doctor or the person giving you the vaccine can give you more information. Tdap may safely be given at the same time as other vaccines. 3. Some people should not get this vaccine  A person who has ever had a life-threatening allergic reaction after a previous dose of any diphtheria, tetanus or pertussis containing vaccine, OR has a severe allergy to any part of this vaccine, should not get Tdap vaccine. Tell the person giving the vaccine about any severe allergies.  Anyone who had coma or long repeated seizures within 7 days after a childhood dose of DTP or DTaP, or a previous dose of Tdap, should not get Tdap, unless a cause other than the vaccine was found. They can still get Td.  Talk to your doctor if you: ? have seizures or another nervous system problem, ? had severe pain or swelling after any vaccine containing diphtheria, tetanus or pertussis, ? ever had a condition called Guillain-Barr Syndrome (GBS), ? aren't feeling well on the day the shot is scheduled. 4. Risks With any medicine, including vaccines,   there is a chance of side effects. These are usually mild and go away on their own. Serious reactions are also possible but are rare. Most people who get Tdap vaccine do not have any problems with it. Mild problems following Tdap (Did not interfere  with activities)  Pain where the shot was given (about 3 in 4 adolescents or 2 in 3 adults)  Redness or swelling where the shot was given (about 1 person in 5)  Mild fever of at least 100.4F (up to about 1 in 25 adolescents or 1 in 100 adults)  Headache (about 3 or 4 people in 10)  Tiredness (about 1 person in 3 or 4)  Nausea, vomiting, diarrhea, stomach ache (up to 1 in 4 adolescents or 1 in 10 adults)  Chills, sore joints (about 1 person in 10)  Body aches (about 1 person in 3 or 4)  Rash, swollen glands (uncommon) Moderate problems following Tdap (Interfered with activities, but did not require medical attention)  Pain where the shot was given (up to 1 in 5 or 6)  Redness or swelling where the shot was given (up to about 1 in 16 adolescents or 1 in 12 adults)  Fever over 102F (about 1 in 100 adolescents or 1 in 250 adults)  Headache (about 1 in 7 adolescents or 1 in 10 adults)  Nausea, vomiting, diarrhea, stomach ache (up to 1 or 3 people in 100)  Swelling of the entire arm where the shot was given (up to about 1 in 500). Severe problems following Tdap (Unable to perform usual activities; required medical attention)  Swelling, severe pain, bleeding and redness in the arm where the shot was given (rare). Problems that could happen after any vaccine:  People sometimes faint after a medical procedure, including vaccination. Sitting or lying down for about 15 minutes can help prevent fainting, and injuries caused by a fall. Tell your doctor if you feel dizzy, or have vision changes or ringing in the ears.  Some people get severe pain in the shoulder and have difficulty moving the arm where a shot was given. This happens very rarely.  Any medication can cause a severe allergic reaction. Such reactions from a vaccine are very rare, estimated at fewer than 1 in a million doses, and would happen within a few minutes to a few hours after the vaccination. As with any medicine,  there is a very remote chance of a vaccine causing a serious injury or death. The safety of vaccines is always being monitored. For more information, visit: www.cdc.gov/vaccinesafety/ 5. What if there is a serious problem? What should I look for?  Look for anything that concerns you, such as signs of a severe allergic reaction, very high fever, or unusual behavior. Signs of a severe allergic reaction can include hives, swelling of the face and throat, difficulty breathing, a fast heartbeat, dizziness, and weakness. These would usually start a few minutes to a few hours after the vaccination. What should I do?  If you think it is a severe allergic reaction or other emergency that can't wait, call 9-1-1 or get the person to the nearest hospital. Otherwise, call your doctor.  Afterward, the reaction should be reported to the Vaccine Adverse Event Reporting System (VAERS). Your doctor might file this report, or you can do it yourself through the VAERS web site at www.vaers.hhs.gov, or by calling 1-800-822-7967. VAERS does not give medical advice. 6. The National Vaccine Injury Compensation Program The National Vaccine Injury Compensation Program (  VICP) is a federal program that was created to compensate people who may have been injured by certain vaccines. Persons who believe they may have been injured by a vaccine can learn about the program and about filing a claim by calling 1-(916)057-4010 or visiting the VICP website at SpiritualWord.atwww.hrsa.gov/vaccinecompensation. There is a time limit to file a claim for compensation. 7. How can I learn more?  Ask your doctor. He or she can give you the vaccine package insert or suggest other sources of information.  Call your local or state health department.  Contact the Centers for Disease Control and Prevention (CDC): ? Call (678)535-41951-701-591-1904 (1-800-CDC-INFO) or ? Visit CDC's website at PicCapture.uywww.cdc.gov/vaccines Vaccine Information Statement Tdap Vaccine (02/15/2014) This  information is not intended to replace advice given to you by your health care provider. Make sure you discuss any questions you have with your health care provider. Document Released: 06/09/2012 Document Revised: 07/27/2018 Document Reviewed: 07/27/2018 Elsevier Interactive Patient Education  2020 [employer]   AREA PEDIATRIC/FAMILY PRACTICE PHYSICIANS  [place] ([id number]) . [facility] Melodie Bouillono Chambliss, MD; Lum BabeEniola, MD; Sheffield SliderHale, MD; Leveda AnnaHensel, MD; McDiarmid, MD; Jerene BearsMcIntyer, MD; Jennette KettleNeal, MD; Gwendolyn GrantWalden, MD o [address] [place], [place] [address] o [phone] o Mon-Fri 8:30-12:30, 1:30-5:00 o Providers [facility] see [facility] at [facility] o Accepting Medicaid . [facility] o Limited providers who accept newborns: Docia ChuckKoirala, MD; Kateri PlummerMorrow, MD; Paulino RilyWolters, MD o [address], [place], [place] [address] o [phone] o Mon-Fri 8:00-5:30 o Babies seen by providers at [facility] o Does NOT accept Medicaid o Please call early in hospitalization for appointment (limited availability)  . [facility] Fatima Sangero Mulberry, MD o [address] [place], [place] [address] o [phone] o Mon, Tue, Thur, Fri 8:30-5:00, Wed 10:00-7:00 (closed 1-2pm) o Babies seen by [facility] providers o Accepting Medicaid . Donnie Coffinubin - Pediatrician Fae Pippino Rubin, MD o [address], [place], [place] [address] o [phone] o Mon-Fri 8:30-5:00, Sat 8:30-12:00 o Provider comes to see babies at [facility] o Accepting Medicaid o Must have been referred from current patients or contacted office prior to delivery . [facility] Leotis Paino Brown, MD; Ave Filterhandler, MD; Luna FuseEttefagh, MD; Kennedy BuckerGrant, MD; Konrad DoloresLester, MD; Kathlene NovemberMcCormick, MD; Jenne CampusMcQueen, MD; Lubertha SouthProse, MD; Wynetta EmerySimha, MD; Duffy RhodyStanley, MD; Gerre CouchStryffeler, NP; Shirl Harrisebben, NP o [address], [place], [place]  [address] o [phone] o Mon, Tue, Thur, Fri 8:30-5:30, Wed 9:30-5:30, Sat 8:30-12:30 o Babies seen by [facility] providers o Accepting Medicaid o Only accepting infants of first-time parents or siblings of current patients [facility] discharge coordinator will make follow-up appointment . Cyril MourningJack Amos o [address], [place], [place]  [address] o [phone]   Fax - [phone] . [facility] o [address], [place], [place]  [address] o Phone - [phone]   Fax - [phone] . Lucio EdwardShilpa Gosrani o [address], [place], [place]  [address] o [phone]  [place] [phone]) . [facility] Jorge Mandrilo Bates, MD; Alita Ch[place], MD; Princella Ionooper, Cox, MD; MD; Earlene Plateravis, MD; Jamesetta Orleansovico, MD; Alvera NovelEttefaugh, MD; Clarene DukeLittle, MD; Rana SnareLowe, MD; Carmon GinsbergKeiffer, MD; Alinda MoneyMelvin, MD; Hosie PoissonSumner, MD; Mayford KnifeWilliams, MD o [address], [place], [place] [address] o [phone] o Mon-Fri 8:30-5:00 (extended evenings Mon-Thur as needed), Sat-Sun 10:00-1:00 o Providers come to see babies at [facility] o Accepting Medicaid for families of first-time babies and families with all  children in the household age 56 and under. Must register with office prior to making appointment (M-F only). [facility] Odella Aquas, NP; Lynelle Doctor, MD; Susann Givens, MD; [place], [place] o [address] [place], [place] [address] o [phone] o Mon-Fri 8:00-5:00 o Babies seen by providers at [facility] o Does NOT accept Medicaid/Commercial Insurance Only . [facility] [place] ([facility])  Suzette Battiest, MD; Zachery Dauer, MD; Stefan Church, MD; Sabino Dick, MD; Quitman Livings, MD; Farris Has, MD; Gaynell Face, MD; Betha Loa, MD; Colon Flattery, MD; Clifton James, MD o [address] [place], [place] [address] o [phone] o Mon-Fri 8:30-5:30, Sat (Oct.-Mar.) 9:00-1:00 o Babies seen by providers at [facility] o Accepting [facility] [phone]) . [facility]  [facility] Dimitri, MD; Sheliah Hatch, MD o [address], [place], [place] [address] o [phone] o Mon-Fri 8:30-5:00, Sat 8:30-12:00 o Providers come to see babies at [facility] o Does NOT accept Medicaid . [facility] Cindy Hazy, [place]; [place]qu[place], MD; [place], [place]; Wynelle Link, MD; Azucena Cecil, MD o [address], [place], [place] [address] o [phone] o Mon-Fri 8:00-5:00 o Babies seen by providers at [facility] o Does NOT accept Medicaid o Only accepting babies of parents who are patients o Please call early in hospitalization for appointment (limited availability) . [facility] Lamar Benes, MD; Abran Cantor, MD; Early Osmond, MD; Cherre Huger, NP; Hyacinth Meeker, MD; Dwan Bolt, MD; Jarold Motto, NP; Dario Guardian, MD; Talmage Nap, MD; Maisie Fus, MD; Pricilla Holm, MD; Tama High, MD o [address] [place], [place], [place] [address] o [phone] o Mon-Fri 8:00-5:00, Sat 9:00-12:00 o Providers come to see babies at [facility] o Does NOT accept [facility] [phone]) . [facility] at [facility] o Limited providers accepting new patients: Drema Pry, NP; [place], [place] o [address], [place], [place] [address] o [phone] o Mon-Fri 8:00-5:00 o Babies seen by providers at [facility] o Does NOT accept Medicaid o Only accepting babies of parents who are patients o Please call early in hospitalization for appointment (limited availability) . [facility] Luan Pulling, MD; Nash Dimmer, MD o [address] [place], [place] [address] o [phone] (press 1 to schedule appointment) o Mon-Fri 8:00-5:00 o Providers come to see babies at [facility] o Does NOT accept Medicaid . [facility] Cristino Martes, MD o [address], [place], [place] [address] o [phone] o Mon-Fri 8:30-5:00 (lunch 12:30-1:00), extended hours by appointment only Wed 5:00-6:30 o Babies seen by [facility] providers o Accepting Medicaid . [facility]  Gwenevere Abbot, MD; [place], MD; Hassan Rowan, MD o [address] [place], [place], [place] [address] o [phone] o Mon-Fri 8:00-5:00 o Babies seen by [facility] providers o Does NOT accept Medicaid . Nature conservation officer at [place] [address] Elsworth Soho, MD; Durene Cal, MD; [place], DO o [address] [place], [place] [address] o [phone] o Mon-Fri 8:00-5:00 o Babies seen by [facility] providers o Does NOT accept Medicaid . [facility] o [place], [place]; [place], [place]; [place], NP; Avis Epley, MD; Vonna Kotyk, MD; Clance Boll, MD; Stevphen Rochester, NP; Arvilla Market, NP; Ann Maki, NP; Otis Dials, NP; Vaughan Basta, MD; [place], MD o [address] [place], [place] [address] o [phone] o Mon-Fri 8:30-5:00, Sat 10:00-1:00 o Providers come to see babies at [facility] o Does NOT accept Medicaid o Free prenatal information session Tuesdays at 4:45pm . [facility] Luna Kitchens, MD; [place], [place]; [place], [place]; [place], [place] o [address] [place] [address] o [phone]  o Mon-Fri 7:30-5:30 o Babies seen by [facility] providers . [facility] o [address], [place], [place]  [address] o [phone]   Fax - [phone]  Power [phone] & [phone]) . [facility] Alphonsa Overall, MD o [address] [place], [place] [address] o [phone] o Mon-Thur 8:00-6:00 o Providers come to see babies at [facility] o Accepting Medicaid . [facility] Zenon Mayo, NP; Cyndia Bent, MD; [place], [place]; [place], [place] o [address] [place], [place] [address] o [phone] o Mon-Thur 7:30-7:30, Fri 7:30-4:30 o Babies seen by [facility] providers o Accepting Medicaid . [facility] Cheryle Horsfall, MD; Janene Harvey, NP; Vonita Moss, MD o [address], [place], [place] [address] o [phone] o Mon-Fri 8:30-5:00, Sat 8:30-12:00 o Providers come to see babies at [facility] o Accepting  Medicaid o Must have "Meet & Greet" appointment at office prior to delivery . [facility] [place] [facility] Llana Aliment, MD; Earlene Plater, MD; Lucretia Roers, MD o [address], [place], [place] [address] o [phone] o Mon-Wed 8:00-6:00, Thur-Fri 8:00-5:00, Sat 9:00-12:00 o Providers come to see babies at [facility] o Does NOT accept Medicaid o Only accepting siblings of current patients . [facility]  o [address], [place], [place]  [address] o [phone]   Fax - [phone] . [facility] at [facility] o [phone] [address] [place], [place]  [address] o [phone]   Fax - [phone]  [place] [phone] & [phone]) . Nature conservation officer at [facility] o [place], DO; [place], DO o [address] [place], [place] [address] o [phone] o Mon-Fri 7:00-5:00 o Babies seen by [facility] providers o Does NOT accept Medicaid . [facility] Ellis Savage, MD; [place], [place]; [place], [place] o [address], [place], [place] [address] o [phone] o Mon-Fri 8:00-5:00 o Babies seen by [facility] providers o Accepting Medicaid . [facility] - [address] Franne Forts, MD; [place], [place]; [place], NP; [place], [place] o [address] [place], [place], [place] [address] o [phone] o Mon-Fri 8:00-5:00 o Babies seen by providers at [facility] o Accepting [facility] [phone]) . [facility] [place], [place] o [address] [place], [place] [address] o [phone] o Mon-Fri 8:00-5:00 o Babies seen by [facility] providers o Does NOT accept Medicaid o Limited availability, please call early in hospitalization to schedule follow-up . [facility] Jolee Ewing, PA; Eddie Candle, MD; [place], MD; [place], [place]; Constance Goltz, MD; [place],  [place] o [facility] [facility] [address], [place], [place] [address] o [phone] o Mon-Fri 8:30-5:00, Sat 9:00-12:00 o Babies seen by providers at [facility] o Accepting Medicaid o Please register online then schedule online or call office o [email or web address] . [facility] at [facility]) Samuella Bruin, NP; Lucianne Muss, MD; [place] Clam, PA o [address], [place], [place] [address] o [phone] o Mon-Fri 8:00-5:00 o Babies seen by providers at [facility] o Accepting Medicaid . [facility]) Sharin Mons, MD; Reed Breech, NP; Shelva Majestic, MD o [address], [place], [place] [address] o [phone] o Mon-Fri 8:00-5:30, Sat&Sun by appointment (phones open at 8:30) o Babies seen by [facility] providers o Accepting Medicaid o Must be a first-time baby  or sibling of current patient . [facility] [address], [place], [place]  [address] o [phone]   Fax - [phone]  [place] [phone] & [phone]) . [facility] o [place], [place]; [place], [place]; Dimple Casey, MD; [place], [place]; Carolyne Fiscal, MD o [address] [place], [place] [address] o [phone] o Mon-Thur 8:00-7:00, Fri 8:00-5:00, Sat 8:00-12:00, Sun 9:00-12:00 o Babies seen by [facility] providers o Accepting Medicaid . [facility] at [facility], MD; Gaynell Face, MD; [facility], MD o [address], [place], [place] [address] o [phone] o Mon-Thur 8:00-5:00 o Babies seen by providers at [facility] o Accepting Medicaid . [facility] [place] Gwenlyn Saran, MD; [place], MD; Madilyn Fireman, MD; Melvyn Neth, MD; List, MD; Lazarus Salines, MD; Gaynell Face, MD; Berneda Rose, MD; Flora Lipps, MD; Beryl Meager, MD; Luther Redo, MD; Lavonia Drafts, MD; Kellie Simmering, MD o [address] [place] [place], [place]  [address] o [phone] o Mon-Fri 8:00-5:30, Sat (Oct.-Mar.) 9:00-1:00 o Babies seen by providers at [facility] o Accepting Medicaid o Must fill out new patient packet, available online at [email or web address] . [facility] [place] [facility] at [facility]) Simone Curia, NP; Tiburcio Pea, NP; Tresa Endo, NP; Whitney Post, MD; [place], [place]; Hennie Duos, MD; Wynne Dust, MD; Kavin Leech, NP o [address], [place], [place] [address] o [phone] o Mon-Thur 8:00-5:30, Fri 8:00-5:00 o Babies seen by providers at [facility] o Accepting [facility] [phone]) . [facility] o [place], [place]; [place], MD; Tanya Nones, MD; [place], [place] o [address] [place], [place] [address] o [phone] o Mon-Fri 8:00-5:00 o Babies seen by providers at [facility] o Accepting [facility] [phone]) . [facility] at [facility] o [place], DO; Lenise Arena, MD; [place], [place] o [address], [place], [place] [address] o [phone] o Mon-Fri 8:00-5:00 o Babies seen by providers at [facility] o Does NOT accept Medicaid o Limited appointment availability, please call early in hospitalization  . Nature conservation officer at [facility] o [place], DO; [place], MD o [address], [place], [place] [address] o [phone] o Mon-Fri 8:00-5:00 o Babies seen by [facility] providers o Does NOT accept Medicaid . [facility] Lorrine Kin, MD; Ninetta Lights, MD; [place], [place]; [place], MD o [address], [place], [place] [address] o [phone] o Mon-Fri 8:00-5:00 o After hours clinic [facility] [place], [place] [address]) [phone] Mon-Fri 5:00-8:00, Sat 12:00-6:00, Sun 10:00-4:00 o Babies seen by [facility] providers o Accepting Medicaid . [facility] at [facility] o [address] [place], [place]  [address] o [phone]   Fax - [phone]  [place] [phone]) . Systems developer at [facility], MD o [address] [place], [place], [place] [address] o [phone] o Mon-Fri 8:00-5:00 o Babies seen by [facility] providers o Does NOT accept Medicaid . [facility] - [place] [facility] at [place]) Tomi Likens, MD o [address] [place] [address], [place], [place] [address] o [phone] o Mon-Thur 8:00-7:00, Fri 8:00-5:00, Sat 8:00-12:00 o Babies seen by providers at [facility] o Accepting Medicaid - but does not have vaccinations in office (must be received elsewhere) o Limited availability, please call early in hospitalization  [place] ([phone]) . [facility]  o Wyvonne Lenz, MD o [address]  [place] [address] o [phone]  Fax [phone]

## 2019-11-11 NOTE — Progress Notes (Signed)
   PRENATAL VISIT NOTE  Subjective:  Christina Velazquez is a 23 y.o. G1P0 at [redacted]w[redacted]d being seen today for ongoing prenatal care.  She is currently monitored for the following issues for this low-risk pregnancy and has Supervision of low-risk pregnancy and Nausea and vomiting in pregnancy on their problem list.  Patient reports no complaints.  Contractions: Not present. Vag. Bleeding: None.  Movement: Present. Denies leaking of fluid.   The following portions of the patient's history were reviewed and updated as appropriate: allergies, current medications, past family history, past medical history, past social history, past surgical history and problem list.   Objective:   Vitals:   11/11/19 1105  BP: 111/73  Pulse: 84  Weight: 133 lb 4.8 oz (60.5 kg)    Fetal Status: Fetal Heart Rate (bpm): 143 Fundal Height: 24 cm Movement: Present     General:  Alert, oriented and cooperative. Patient is in no acute distress.  Skin: Skin is warm and dry. No rash noted.   Cardiovascular: Normal heart rate noted  Respiratory: Normal respiratory effort, no problems with respiration noted  Abdomen: Soft, gravid, appropriate for gestational age.  Pain/Pressure: Present     Pelvic: Cervical exam deferred        Extremities: Normal range of motion.  Edema: None  Mental Status: Normal mood and affect. Normal behavior. Normal judgment and thought content.   Assessment and Plan:  Pregnancy: G1P0 at [redacted]w[redacted]d  1. Encounter for supervision of low-risk pregnancy in first trimester  Doing well Pharmacy called to discuss BP cuff. Will provide one for patient today.  List for peds given Flu vaccine given   2. Nausea and vomiting in pregnancy  Resolved   There are no diagnoses linked to this encounter. Preterm labor symptoms and general obstetric precautions including but not limited to vaginal bleeding, contractions, leaking of fluid and fetal movement were reviewed in detail with the patient. Please refer to  After Visit Summary for other counseling recommendations.   Return in about 4 weeks (around 12/09/2019) for 2 hour GTT needed.  No future appointments.  Noni Saupe, NP

## 2019-11-12 ENCOUNTER — Encounter: Payer: Self-pay | Admitting: *Deleted

## 2019-12-02 ENCOUNTER — Other Ambulatory Visit: Payer: Self-pay | Admitting: *Deleted

## 2019-12-02 DIAGNOSIS — Z3400 Encounter for supervision of normal first pregnancy, unspecified trimester: Secondary | ICD-10-CM

## 2019-12-06 ENCOUNTER — Other Ambulatory Visit: Payer: Self-pay

## 2019-12-06 ENCOUNTER — Other Ambulatory Visit: Payer: Medicaid Other

## 2019-12-06 ENCOUNTER — Ambulatory Visit (INDEPENDENT_AMBULATORY_CARE_PROVIDER_SITE_OTHER): Payer: Self-pay | Admitting: Obstetrics & Gynecology

## 2019-12-06 VITALS — BP 121/77 | HR 93 | Wt 140.8 lb

## 2019-12-06 DIAGNOSIS — Z23 Encounter for immunization: Secondary | ICD-10-CM

## 2019-12-06 DIAGNOSIS — Z3400 Encounter for supervision of normal first pregnancy, unspecified trimester: Secondary | ICD-10-CM

## 2019-12-06 DIAGNOSIS — Z3492 Encounter for supervision of normal pregnancy, unspecified, second trimester: Secondary | ICD-10-CM

## 2019-12-06 DIAGNOSIS — Z3491 Encounter for supervision of normal pregnancy, unspecified, first trimester: Secondary | ICD-10-CM

## 2019-12-06 DIAGNOSIS — Z3A27 27 weeks gestation of pregnancy: Secondary | ICD-10-CM

## 2019-12-06 NOTE — Progress Notes (Signed)
   PRENATAL VISIT NOTE  Subjective:  Christina Velazquez is a 23 y.o. G1P0 at [redacted]w[redacted]d being seen today for ongoing prenatal care.  She is currently monitored for the following issues for this low-risk pregnancy and has Supervision of low-risk pregnancy and Nausea and vomiting in pregnancy on their problem list.  Patient reports no complaints.  Contractions: Not present. Vag. Bleeding: None.  Movement: Present. Denies leaking of fluid.   The following portions of the patient's history were reviewed and updated as appropriate: allergies, current medications, past family history, past medical history, past social history, past surgical history and problem list.   Objective:   Vitals:   12/06/19 0845  BP: 121/77  Pulse: 93  Weight: 140 lb 12.8 oz (63.9 kg)    Fetal Status: Fetal Heart Rate (bpm): 136   Movement: Present     General:  Alert, oriented and cooperative. Patient is in no acute distress.  Skin: Skin is warm and dry. No rash noted.   Cardiovascular: Normal heart rate noted  Respiratory: Normal respiratory effort, no problems with respiration noted  Abdomen: Soft, gravid, appropriate for gestational age.  Pain/Pressure: Absent     Pelvic: Cervical exam deferred        Extremities: Normal range of motion.  Edema: None  Mental Status: Normal mood and affect. Normal behavior. Normal judgment and thought content.   Assessment and Plan:  Pregnancy: G1P0 at [redacted]w[redacted]d 1. Encounter for supervision of low-risk first pregnancy, antepartum  - Tdap vaccine greater than or equal to 7yo IM  2. Encounter for supervision of low-risk pregnancy in first trimester - 28 week labs, TDAP today  Preterm labor symptoms and general obstetric precautions including but not limited to vaginal bleeding, contractions, leaking of fluid and fetal movement were reviewed in detail with the patient. Please refer to After Visit Summary for other counseling recommendations.   No follow-ups on file.  No future  appointments.  Emily Filbert, MD

## 2019-12-07 LAB — GLUCOSE TOLERANCE, 2 HOURS W/ 1HR
Glucose, 1 hour: 92 mg/dL (ref 65–179)
Glucose, 2 hour: 72 mg/dL (ref 65–152)
Glucose, Fasting: 78 mg/dL (ref 65–91)

## 2019-12-07 LAB — CBC
Hematocrit: 30.2 % — ABNORMAL LOW (ref 34.0–46.6)
Hemoglobin: 9.8 g/dL — ABNORMAL LOW (ref 11.1–15.9)
MCH: 27.1 pg (ref 26.6–33.0)
MCHC: 32.5 g/dL (ref 31.5–35.7)
MCV: 83 fL (ref 79–97)
Platelets: 275 10*3/uL (ref 150–450)
RBC: 3.62 x10E6/uL — ABNORMAL LOW (ref 3.77–5.28)
RDW: 12.7 % (ref 11.7–15.4)
WBC: 7.8 10*3/uL (ref 3.4–10.8)

## 2019-12-07 LAB — HIV ANTIBODY (ROUTINE TESTING W REFLEX): HIV Screen 4th Generation wRfx: NONREACTIVE

## 2019-12-07 LAB — RPR: RPR Ser Ql: NONREACTIVE

## 2019-12-08 ENCOUNTER — Telehealth: Payer: Self-pay | Admitting: Family Medicine

## 2019-12-08 ENCOUNTER — Other Ambulatory Visit: Payer: Self-pay | Admitting: Obstetrics & Gynecology

## 2019-12-08 ENCOUNTER — Encounter: Payer: Self-pay | Admitting: Obstetrics & Gynecology

## 2019-12-08 DIAGNOSIS — O99013 Anemia complicating pregnancy, third trimester: Secondary | ICD-10-CM

## 2019-12-08 DIAGNOSIS — O99019 Anemia complicating pregnancy, unspecified trimester: Secondary | ICD-10-CM | POA: Insufficient documentation

## 2019-12-08 NOTE — Telephone Encounter (Signed)
Patient called in and stated that a nurse had sent her message about her 2 hour sugar test and hemoglobin results. Patient stated she would like someone to call her back so she can better understand what her results mean. Patient instructed that a message will be sent to the nurses and they will reach out to her as soon as they can. Patient verbalized understanding. Message sent to clinical pool.

## 2019-12-08 NOTE — Telephone Encounter (Signed)
Called pt and informed that her glucose tolerance test is normal. Pt reports she is in [place] until after the [employer]. She wanted to have her infusions there. Discussed it would need to have a doctors order and would be best to have them here. Pt reports she can come back to have them as needed.   Called Short Stay to schedule Feraheme infusions. 1st scheduled for 12/23 @ 1 pm. Pt was informed to arrive at admitting at 12:45 for infusion. She is aware where [facility].   Called pt back to inform her of her appt and when and where to arrive. Pt voiced understanding. Reviewed the importance of getting the infusions to help with raising her Hgb. Pt voiced understanding. Pt has Short Stay phone # to call if she needs to change her appt.

## 2019-12-08 NOTE — Progress Notes (Signed)
fereheme x 2 doses ordered

## 2019-12-15 ENCOUNTER — Other Ambulatory Visit: Payer: Self-pay

## 2019-12-15 ENCOUNTER — Ambulatory Visit (HOSPITAL_COMMUNITY)
Admission: RE | Admit: 2019-12-15 | Discharge: 2019-12-15 | Disposition: A | Discharge status: Home / Self Care | Payer: Medicaid Other | Source: Ambulatory Visit | Attending: Obstetrics & Gynecology | Admitting: Obstetrics & Gynecology

## 2019-12-15 DIAGNOSIS — R42 Dizziness and giddiness: Secondary | ICD-10-CM | POA: Insufficient documentation

## 2019-12-15 DIAGNOSIS — O9A213 Injury, poisoning and certain other consequences of external causes complicating pregnancy, third trimester: Secondary | ICD-10-CM | POA: Insufficient documentation

## 2019-12-15 DIAGNOSIS — R0602 Shortness of breath: Secondary | ICD-10-CM | POA: Diagnosis not present

## 2019-12-15 DIAGNOSIS — Z3A28 28 weeks gestation of pregnancy: Secondary | ICD-10-CM | POA: Diagnosis not present

## 2019-12-15 DIAGNOSIS — T454X5A Adverse effect of iron and its compounds, initial encounter: Secondary | ICD-10-CM | POA: Insufficient documentation

## 2019-12-15 DIAGNOSIS — R079 Chest pain, unspecified: Secondary | ICD-10-CM | POA: Insufficient documentation

## 2019-12-15 DIAGNOSIS — O99891 Other specified diseases and conditions complicating pregnancy: Secondary | ICD-10-CM | POA: Insufficient documentation

## 2019-12-15 MED ORDER — SODIUM CHLORIDE 0.9 % IV SOLN
510.0000 mg | INTRAVENOUS | Status: DC
  Administered 2019-12-15: 510 mg via INTRAVENOUS
  Filled 2019-12-15: qty 17

## 2019-12-15 NOTE — Progress Notes (Signed)
G1P0 at 28 6/7 weeks in Medical Day for IV feraheme.  Called to come and monitor pt after allergic reaction to IV iron.  Pt had c/o chest pain, SOB and dizzyness.  Arrived and pt complaints had subsided.  VSS.    Dr Ernestina Patches on L+D.  Updated on pt complaints.  Will obtain an NST.  Next PNV is a virtual visit on 1/4.

## 2019-12-15 NOTE — Progress Notes (Addendum)
Iv Feraheme infusion started at 1325 and by 1330 pt called out stating that her chest hurt, she was SOB and nauseous. Feraheme immediately stopped and liter of Ns initiated. Pts BP 132/82 Pulse 83 and sat 99. We had a hard time getting a good pulse on baby ( maybe in the low to mid 90's.) Rapid response notified. VS at 1339 BP 114/74 pulse 98 and o2 sat 98. Baby at 131. Pt states her symptoms have subsided at this time and OB rapid response here.  1430 rapid response feels pt and baby are doing well and may be discharged.

## 2019-12-15 NOTE — Discharge Instructions (Signed)

## 2019-12-22 ENCOUNTER — Encounter (HOSPITAL_COMMUNITY): Payer: Medicaid Other

## 2019-12-24 NOTE — L&D Delivery Note (Signed)
OB/GYN Faculty Practice Delivery Note  Christina Velazquez is a 24 y.o. G1P0 at [redacted]w[redacted]d. She was admitted for IOL d/t PEC w/ SF (BP, HA).   ROM:  1h 44m with clear fluid GBS Status: unknown with adequate PCN  Maximum Maternal Temperature: 98.6*F  Labor Progress: . Patient presented for IOL long/50/high, received 6 doses of cytotec, a foley bulb, progessed to complete with pitocin. Patient had a bulging bag that was AROMed 1.5 hours prior to delivery.  Delivery Date/Time: 01/31/20 at 0949 Delivery: Called to room and patient was complete and pushing. Head delivered DOA. No nuchal cord present. Shoulder and body delivered in usual fashion. Infant with spontaneous cry, placed on mother's abdomen, dried and stimulated. Cord clamped x 2 after 1-minute delay, and cut by FOB. Cord blood drawn. Placenta delivered spontaneously with gentle cord traction. Fundus was not firm, but improved to firm with lower uterine and fundal massage and Pitocin. Cytotec 1000mg  PR and TXA given. Labia, perineum, vagina, and cervix inspected with no tears.   Placenta: 3VC, intact, to path Complications: none Lacerations: none EBL: 545 mL Analgesia: epidural  Postpartum Planning [x]  message to sent to schedule follow-up  [ ]  vaccines UTD  Infant: female  1980g APGAR (1 MIN): 8   APGAR (5 MINS): 9   APGAR (10 MINS):    , MD [facility], PGY-1 01/31/2020, 10:18 AM  OB FELLOW DELIVERY ATTESTATION  I was gloved and present for the delivery in its entirety, and I agree with the above resident's note.    Shirlean Mylar, MD [facility] Family [facility] Fellow, [facility] for Christina Velazquez, [facility]

## 2019-12-27 ENCOUNTER — Encounter: Payer: Self-pay | Admitting: Nurse Practitioner

## 2019-12-27 ENCOUNTER — Telehealth (INDEPENDENT_AMBULATORY_CARE_PROVIDER_SITE_OTHER): Payer: Self-pay | Admitting: Nurse Practitioner

## 2019-12-27 DIAGNOSIS — O99013 Anemia complicating pregnancy, third trimester: Secondary | ICD-10-CM

## 2019-12-27 DIAGNOSIS — Z3A30 30 weeks gestation of pregnancy: Secondary | ICD-10-CM

## 2019-12-27 DIAGNOSIS — Z3491 Encounter for supervision of normal pregnancy, unspecified, first trimester: Secondary | ICD-10-CM

## 2019-12-27 DIAGNOSIS — O219 Vomiting of pregnancy, unspecified: Secondary | ICD-10-CM

## 2019-12-27 MED ORDER — POLYSACCHARIDE IRON COMPLEX 150 MG PO CAPS: 150.0000 mg | ORAL_CAPSULE | Freq: Every day | ORAL | Status: DC

## 2019-12-27 NOTE — Progress Notes (Signed)
I connected with@ on 12/27/19 at  3:55 PM EST by: MyChart and verified that I am speaking with the correct person using two identifiers.  Patient is located at home and provider is located at [facility].     The purpose of this virtual visit is to provide medical care while limiting exposure to the novel coronavirus. I discussed the limitations, risks, security and privacy concerns of performing an evaluation and management service by Nolene Bernheim, NP and the availability of in person appointments. I also discussed with the patient that there may be a patient responsible charge related to this service. By engaging in this virtual visit, you consent to the provision of healthcare.  Additionally, you authorize for your insurance to be billed for the services provided during this visit.  The patient expressed understanding and agreed to proceed.  The following staff members participated in the virtual visit:  Nolene Bernheim, NP and Maxwell Marion, RN    PRENATAL VISIT NOTE  Subjective:  Christina Velazquez is a 24 y.o. G1P0 at [redacted]w[redacted]d  for phone visit for ongoing prenatal care.  She is currently monitored for the following issues for this low-risk pregnancy and has Supervision of low-risk pregnancy; Nausea and vomiting in pregnancy; and Anemia in pregnancy on their problem list.  Patient reports nausea.  Contractions: Not present. Vag. Bleeding: None.  Movement: Present. Denies leaking of fluid.   The following portions of the patient's history were reviewed and updated as appropriate: allergies, current medications, past family history, past medical history, past social history, past surgical history and problem list.   Objective:  There were no vitals filed for this visit. Self-Obtained  Fetal Status:     Movement: Present     Assessment and Plan:  Pregnancy: G1P0 at [redacted]w[redacted]d 1. Anemia during pregnancy in third trimester Had reaction to fereheme and did not get the infusion With HGB of 9.8, will  try oral supplementation - consult with Dr. Adrian Blackwater.  Could try Injectafar infusion if iron continues to be low. MyChart message sent to client about oral iron supplement sent to her pharmacy.  2. Nausea and vomiting in pregnancy Prescribed Diclegis  3. Encounter for supervision of low-risk pregnancy in first trimester Will check BP and enter into Babyscripts after the visit today.  Plans to move to [facility] on Feb. 14.  Advised to find [facility] doctor and make an appointment.  Will continue care in this office until her move.  Preterm labor symptoms and general obstetric precautions including but not limited to vaginal bleeding, contractions, leaking of fluid and fetal movement were reviewed in detail with the patient.  Return in about 2 weeks (around 01/10/2020) for virtual visit ROB.  Future Appointments  Date Time Provider Department Center  01/10/2020  2:15 PM Sidda Humm, Brand Males, NP [facility]     Time spent on virtual visit: 12 minutes  Currie Paris, NP

## 2019-12-27 NOTE — Patient Instructions (Signed)

## 2019-12-27 NOTE — Progress Notes (Addendum)
I connected with  Christina Velazquez on 12/27/19 at  3:55 PM EST by MyChart and verified that I am speaking with the correct person using two identifiers.   I discussed the limitations, risks, security and privacy concerns of performing an evaluation and management service by telephone and the availability of in person appointments. I also discussed with the patient that there may be a patient responsible charge related to this service. The patient expressed understanding and agreed to proceed.  Pt does not have BP cuff with her. Requested that pt check BP when she is at home and log into Babyscripts. Denies s/s of hypertension.  Marjo Bicker, RN 12/27/2019  3:58 PM

## 2020-01-10 ENCOUNTER — Other Ambulatory Visit: Payer: Self-pay

## 2020-01-10 ENCOUNTER — Telehealth (INDEPENDENT_AMBULATORY_CARE_PROVIDER_SITE_OTHER): Payer: Self-pay | Admitting: Nurse Practitioner

## 2020-01-10 DIAGNOSIS — Z3493 Encounter for supervision of normal pregnancy, unspecified, third trimester: Secondary | ICD-10-CM

## 2020-01-10 DIAGNOSIS — O99013 Anemia complicating pregnancy, third trimester: Secondary | ICD-10-CM

## 2020-01-10 DIAGNOSIS — O219 Vomiting of pregnancy, unspecified: Secondary | ICD-10-CM

## 2020-01-10 DIAGNOSIS — Z3A32 32 weeks gestation of pregnancy: Secondary | ICD-10-CM

## 2020-01-10 NOTE — Progress Notes (Signed)
2:02p-Called pt, no answer, left Vm that will call back in 10 to 15 minutes.   2:20p-   I connected with  Christina Velazquez on 01/10/20 at  2:15 PM EST by telephone and verified that I am speaking with the correct person using two identifiers.   I discussed the limitations, risks, security and privacy concerns of performing an evaluation and management service by telephone and the availability of in person appointments. I also discussed with the patient that there may be a patient responsible charge related to this service. The patient expressed understanding and agreed to proceed.  Henrietta Dine, CMA 01/10/2020  2:22 PM

## 2020-01-10 NOTE — Progress Notes (Signed)
I connected with@ on 01/10/20 at  2:15 PM EST by: MyChart and verified that I am speaking with the correct person using two identifiers.  Patient is located at work and provider is located at [facility] office.     The purpose of this virtual visit is to provide medical care while limiting exposure to the novel coronavirus. I discussed the limitations, risks, security and privacy concerns of performing an evaluation and management service by MyChart and the availability of in person appointments. I also discussed with the patient that there may be a patient responsible charge related to this service. By engaging in this virtual visit, you consent to the provision of healthcare.  Additionally, you authorize for your insurance to be billed for the services provided during this visit.  The patient expressed understanding and agreed to proceed.  The following staff members participated in the virtual visit:  Marykay Lex, CMA and Nolene Bernheim, NP    PRENATAL VISIT NOTE  Subjective:  Christina Velazquez is a 24 y.o. G1P0 at [redacted]w[redacted]d  for phone visit for ongoing prenatal care.  She is currently monitored for the following issues for this low-risk pregnancy and has Supervision of low-risk pregnancy; Nausea and vomiting in pregnancy; and Anemia in pregnancy on their problem list.  Patient reports Braxton Hicks contractions.  Contractions: Not present. Vag. Bleeding: None.  Movement: Present. Denies leaking of fluid. Feeling contractions at night but they stop.  The following portions of the patient's history were reviewed and updated as appropriate: allergies, current medications, past family history, past medical history, past social history, past surgical history and problem list.   Objective:  There were no vitals filed for this visit. Self-Obtained  Client is at work and will do BP at home and enter into Babyscrripts.  Fetal Status:     Movement: Present     Assessment and Plan:  Pregnancy: G1P0 at  [redacted]w[redacted]d 1. Anemia during pregnancy in third trimester Did not start iron supplements but did pay attention and has incorporated iron rich foods into her diet.  2. Nausea and vomiting in pregnancy Has resolved at this time.  3. Encounter for supervision of low-risk pregnancy in third trimester Still planning to move to [place]. Has called MD but does not yet have appointment time.  Will follow up this week.  Arranged appointments in this office. Advised to bring name, address and phone number of new provider so she can sign a release and records can be sent to her new office.  Preterm labor symptoms and general obstetric precautions including but not limited to vaginal bleeding, contractions, leaking of fluid and fetal movement were reviewed in detail with the patient.  Return in about 2 weeks (around 01/24/2020) for Virtual visit and set an IN PERSON VISIT for 36 weeks on 02-03-20.  No future appointments.   Time spent on virtual visit:  12  Minutes with 7 minutes of  face to face interaction time  Currie Paris, NP

## 2020-01-24 ENCOUNTER — Telehealth (INDEPENDENT_AMBULATORY_CARE_PROVIDER_SITE_OTHER): Payer: Medicaid Other | Admitting: Nurse Practitioner

## 2020-01-24 DIAGNOSIS — O219 Vomiting of pregnancy, unspecified: Secondary | ICD-10-CM | POA: Diagnosis not present

## 2020-01-24 DIAGNOSIS — Z3A34 34 weeks gestation of pregnancy: Secondary | ICD-10-CM

## 2020-01-24 DIAGNOSIS — O99013 Anemia complicating pregnancy, third trimester: Secondary | ICD-10-CM

## 2020-01-24 DIAGNOSIS — Z3493 Encounter for supervision of normal pregnancy, unspecified, third trimester: Secondary | ICD-10-CM

## 2020-01-24 NOTE — Progress Notes (Signed)
I connected with@ on 01/24/20 at  4:15 PM EST by: MyChart and verified that I am speaking with the correct person using two identifiers.  Patient is located at work and provider is located at [facility].     The purpose of this virtual visit is to provide medical care while limiting exposure to the novel coronavirus. I discussed the limitations, risks, security and privacy concerns of performing an evaluation and management service by MyChart and the availability of in person appointments. I also discussed with the patient that there may be a patient responsible charge related to this service. By engaging in this virtual visit, you consent to the provision of healthcare.  Additionally, you authorize for your insurance to be billed for the services provided during this visit.  The patient expressed understanding and agreed to proceed.  The following staff members participated in the virtual visit:  Corinda Gubler, CMA and Nolene Bernheim, NP    PRENATAL VISIT NOTE  Subjective:  Christina Velazquez is a 24 y.o. G1P0 at [redacted]w[redacted]d  for phone visit for ongoing prenatal care.  She is currently monitored for the following issues for this low-risk pregnancy and has Supervision of low-risk pregnancy; Nausea and vomiting in pregnancy; and Anemia in pregnancy on their problem list.  Patient reports no complaints.  Contractions: Not present. Vag. Bleeding: None.  Movement: Present. Denies leaking of fluid.   The following portions of the patient's history were reviewed and updated as appropriate: allergies, current medications, past family history, past medical history, past social history, past surgical history and problem list.   Objective:  There were no vitals filed for this visit. Self-Obtained  Fetal Status:     Movement: Present     Assessment and Plan:  Pregnancy: G1P0 at [redacted]w[redacted]d 1. Anemia during pregnancy in third trimester Plan to check CBC next visit Encouraged to be taking vitamins  2. Nausea and vomiting in  pregnancy Resolved  3. Encounter for supervision of low-risk pregnancy in third trimester Still is planning to move prior to birth of baby Next visit in person in 2 weeks for vaginal swabs. Still encouraged to take online childbirth and breastfeeding classes. Reviewed BPs in Babyscripts and they are normal but reminded client to take BP weekly please.  Could not take BP today as she was at work and working during this visit.  Preterm labor symptoms and general obstetric precautions including but not limited to vaginal bleeding, contractions, leaking of fluid and fetal movement were reviewed in detail with the patient.  Return in about 2 weeks (around 02/07/2020) for already scheduled for inperson visit for 36 week labs.  Future Appointments  Date Time Provider Department Center  02/07/2020  1:15 PM Currie Paris, NP [facility]  02/14/2020 11:15 AM Armando Reichert, CNM [facility]  02/21/2020  1:55 PM Marny Lowenstein, PA-C [facility]  02/29/2020 11:15 AM Marvetta Gibbons, Brand Males, NP [facility]     Time spent on virtual visit: 7 minutes  Currie Paris, NP

## 2020-01-24 NOTE — Progress Notes (Signed)
4:26p- Called pt for My Chart visit, no answer, left VM that will call back in 10 to 15 minutes.  4:52p-  I connected with  Christina Velazquez on 01/24/20 at  4:15 PM EST by telephone and verified that I am speaking with the correct person using two identifiers.   I discussed the limitations, risks, security and privacy concerns of performing an evaluation and management service by telephone and the availability of in person appointments. I also discussed with the patient that there may be a patient responsible charge related to this service. The patient expressed understanding and agreed to proceed.  Henrietta Dine, CMA 01/24/2020  4:52 PM

## 2020-01-25 ENCOUNTER — Encounter: Payer: Self-pay | Admitting: Nurse Practitioner

## 2020-01-27 ENCOUNTER — Inpatient Hospital Stay (HOSPITAL_COMMUNITY)
Admission: AD | Admit: 2020-01-27 | Discharge: 2020-01-27 | Disposition: A | Discharge status: Home / Self Care | Payer: Medicaid Other | Attending: Obstetrics and Gynecology | Admitting: Obstetrics and Gynecology

## 2020-01-27 ENCOUNTER — Encounter (HOSPITAL_COMMUNITY): Payer: Self-pay | Admitting: Obstetrics and Gynecology

## 2020-01-27 DIAGNOSIS — O99013 Anemia complicating pregnancy, third trimester: Secondary | ICD-10-CM

## 2020-01-27 DIAGNOSIS — O133 Gestational [pregnancy-induced] hypertension without significant proteinuria, third trimester: Secondary | ICD-10-CM | POA: Insufficient documentation

## 2020-01-27 DIAGNOSIS — Z3493 Encounter for supervision of normal pregnancy, unspecified, third trimester: Secondary | ICD-10-CM

## 2020-01-27 DIAGNOSIS — R519 Headache, unspecified: Secondary | ICD-10-CM

## 2020-01-27 DIAGNOSIS — Z3A35 35 weeks gestation of pregnancy: Secondary | ICD-10-CM | POA: Insufficient documentation

## 2020-01-27 DIAGNOSIS — R030 Elevated blood-pressure reading, without diagnosis of hypertension: Secondary | ICD-10-CM | POA: Diagnosis present

## 2020-01-27 DIAGNOSIS — Z3689 Encounter for other specified antenatal screening: Secondary | ICD-10-CM

## 2020-01-27 DIAGNOSIS — O139 Gestational [pregnancy-induced] hypertension without significant proteinuria, unspecified trimester: Secondary | ICD-10-CM | POA: Insufficient documentation

## 2020-01-27 HISTORY — DX: Anemia, unspecified: D64.9

## 2020-01-27 LAB — COMPREHENSIVE METABOLIC PANEL
ALT: 14 U/L (ref 0–44)
AST: 16 U/L (ref 15–41)
Albumin: 2.7 g/dL — ABNORMAL LOW (ref 3.5–5.0)
Alkaline Phosphatase: 101 U/L (ref 38–126)
Anion gap: 10 (ref 5–15)
BUN: 7 mg/dL (ref 6–20)
CO2: 24 mmol/L (ref 22–32)
Calcium: 8.9 mg/dL (ref 8.9–10.3)
Chloride: 105 mmol/L (ref 98–111)
Creatinine, Ser: 0.82 mg/dL (ref 0.44–1.00)
GFR calc Af Amer: >60 mL/min (ref >60)
GFR calc non Af Amer: >60 mL/min (ref >60)
Glucose, Bld: 81 mg/dL (ref 70–99)
Potassium: 3.4 mmol/L — ABNORMAL LOW (ref 3.5–5.1)
Sodium: 139 mmol/L (ref 135–145)
Total Bilirubin: 0.4 mg/dL (ref 0.3–1.2)
Total Protein: 5.6 g/dL — ABNORMAL LOW (ref 6.5–8.1)

## 2020-01-27 LAB — URINALYSIS, ROUTINE W REFLEX MICROSCOPIC
Bilirubin Urine: NEGATIVE
Glucose, UA: NEGATIVE mg/dL
Hgb urine dipstick: NEGATIVE
Ketones, ur: NEGATIVE mg/dL
Nitrite: NEGATIVE
Protein, ur: 30 mg/dL — AB
Specific Gravity, Urine: 1.025 (ref 1.005–1.030)
pH: 6 (ref 5.0–8.0)

## 2020-01-27 LAB — CBC
HCT: 32 % — ABNORMAL LOW (ref 36.0–46.0)
Hemoglobin: 10.4 g/dL — ABNORMAL LOW (ref 12.0–15.0)
MCH: 27.7 pg (ref 26.0–34.0)
MCHC: 32.5 g/dL (ref 30.0–36.0)
MCV: 85.1 fL (ref 80.0–100.0)
Platelets: 272 10*3/uL (ref 150–400)
RBC: 3.76 MIL/uL — ABNORMAL LOW (ref 3.87–5.11)
RDW: 13.9 % (ref 11.5–15.5)
WBC: 8 10*3/uL (ref 4.0–10.5)
nRBC: 0 % (ref 0.0–0.2)

## 2020-01-27 LAB — PROTEIN / CREATININE RATIO, URINE
Creatinine, Urine: 269.99 mg/dL
Protein Creatinine Ratio: 0.11 mg/mg{Cre} (ref 0.00–0.15)
Total Protein, Urine: 30 mg/dL

## 2020-01-27 MED ORDER — BUTALBITAL-APAP-CAFFEINE 50-325-40 MG PO TABS
1.0000 | ORAL_TABLET | Freq: Four times a day (QID) | ORAL | Status: DC | PRN
  Administered 2020-01-27: 1 via ORAL
  Filled 2020-01-27: qty 1

## 2020-01-27 NOTE — MAU Provider Note (Signed)
History     CSN: [id number]  Arrival date and time: 01/27/20 1346   First Provider Initiated Contact with Patient 01/27/20 1434      Chief Complaint  Patient presents with  . Hypertension  . Headache   23 y.o. G1 '@35' .0 wks presenting with HA and elevated BP. Reports onset a few days ago. Located in frontal region. Rates 4/10. Has not taken anything for it. Reports elevated BPs at home 160s/100s. Denies visual disturbances, epigastric pain, CP, or SOB. Reports +FM. No Vb, LOF, or ctx. No hx of HTN   OB History    Gravida  1   Para      Term      Preterm      AB      Living  0     SAB      TAB      Ectopic      Multiple      Live Births              Past Medical History:  Diagnosis Date  . Anemia   . Medical history non-contributory     Past Surgical History:  Procedure Laterality Date  . NO PAST SURGERIES      Family History  Problem Relation Age of Onset  . Stroke Father     Social History   Tobacco Use  . Smoking status: Never Smoker  . Smokeless tobacco: Never Used  Substance Use Topics  . Alcohol use: Not Currently    Comment: social drinking  . Drug use: No    Allergies:  Allergies  Allergen Reactions  . Feraheme [Ferumoxytol]     Chest pain, SOB, diaphoretic, and nausea    No medications prior to admission.    Review of Systems  Eyes: Negative for visual disturbance.  Respiratory: Negative for shortness of breath.   Cardiovascular: Negative for chest pain.  Gastrointestinal: Negative for abdominal pain.  Neurological: Positive for headaches.   Physical Exam   Blood pressure (!) 133/93, pulse 84, height '4\' 11"'  (1.499 m), weight 67.4 kg, last menstrual period 03/25/2019, SpO2 99 %. Patient Vitals for the past 24 hrs:  BP Pulse SpO2 Height Weight  01/27/20 1616 (!) 133/93 -- -- -- --  01/27/20 1554 -- -- 99 % -- --  01/27/20 1549 -- -- 99 % -- --  01/27/20 1545 (!) 133/93 84 -- -- --  01/27/20 1544 -- -- 99 % -- --   01/27/20 1539 -- -- 99 % -- --  01/27/20 1534 -- -- 99 % -- --  01/27/20 1531 (!) 142/92 84 98 % -- --  01/27/20 1530 (!) 142/92 84 -- -- --  01/27/20 1529 -- -- 98 % -- --  01/27/20 1524 -- -- 98 % -- --  01/27/20 1519 -- -- 99 % -- --  01/27/20 1515 129/80 100 -- -- --  01/27/20 1500 (!) 144/91 88 -- -- --  01/27/20 1459 -- -- 98 % -- --  01/27/20 1454 -- -- 100 % -- --  01/27/20 1449 -- -- 99 % -- --  01/27/20 1445 (!) 147/94 90 -- -- --  01/27/20 1444 -- -- 99 % -- --  01/27/20 1439 -- -- 99 % -- --  01/27/20 1434 -- -- 99 % -- --  01/27/20 1429 -- -- 99 % -- --  01/27/20 1426 (!) 157/99 84 -- -- --  01/27/20 1424 -- -- 99 % -- --  01/27/20 1410 -- -- --  '4\' 11"'  (1.499 m) 67.4 kg    Physical Exam  Nursing note and vitals reviewed. Constitutional: She is oriented to person, place, and time. She appears well-developed and well-nourished. No distress.  HENT:  Head: Normocephalic and atraumatic.  Cardiovascular: Normal rate.  Respiratory: Effort normal. No respiratory distress.  GI: Soft. She exhibits no distension. There is no abdominal tenderness.  gravid  Musculoskeletal:        General: No edema. Normal range of motion.     Cervical back: Normal range of motion.  Neurological: She is alert and oriented to person, place, and time.  Skin: Skin is warm and dry.  Psychiatric: She has a normal mood and affect.  EFM: 130 bpm, mod variability, + accels, no decels Toco: none  Results for orders placed or performed during the hospital encounter of 01/27/20 (from the past 24 hour(s))  Urinalysis, Routine w reflex microscopic     Status: Abnormal   Collection Time: 01/27/20  2:29 PM  Result Value Ref Range   Color, Urine AMBER (A) YELLOW   APPearance CLOUDY (A) CLEAR   Specific Gravity, Urine 1.025 1.005 - 1.030   pH 6.0 5.0 - 8.0   Glucose, UA NEGATIVE NEGATIVE mg/dL   Hgb urine dipstick NEGATIVE NEGATIVE   Bilirubin Urine NEGATIVE NEGATIVE   Ketones, ur NEGATIVE  NEGATIVE mg/dL   Protein, ur 30 (A) NEGATIVE mg/dL   Nitrite NEGATIVE NEGATIVE   Leukocytes,Ua SMALL (A) NEGATIVE   RBC / HPF 0-5 0 - 5 RBC/hpf   WBC, UA 11-20 0 - 5 WBC/hpf   Bacteria, UA MANY (A) NONE SEEN   Squamous Epithelial / LPF 21-50 0 - 5   Mucus PRESENT    Non Squamous Epithelial 0-5 (A) NONE SEEN  Protein / creatinine ratio, urine     Status: None   Collection Time: 01/27/20  2:29 PM  Result Value Ref Range   Creatinine, Urine 269.99 mg/dL   Total Protein, Urine 30 mg/dL   Protein Creatinine Ratio 0.11 0.00 - 0.15 mg/mg[Cre]  Comprehensive metabolic panel     Status: Abnormal   Collection Time: 01/27/20  2:32 PM  Result Value Ref Range   Sodium 139 135 - 145 mmol/L   Potassium 3.4 (L) 3.5 - 5.1 mmol/L   Chloride 105 98 - 111 mmol/L   CO2 24 22 - 32 mmol/L   Glucose, Bld 81 70 - 99 mg/dL   BUN 7 6 - 20 mg/dL   Creatinine, Ser 0.82 0.44 - 1.00 mg/dL   Calcium 8.9 8.9 - 10.3 mg/dL   Total Protein 5.6 (L) 6.5 - 8.1 g/dL   Albumin 2.7 (L) 3.5 - 5.0 g/dL   AST 16 15 - 41 U/L   ALT 14 0 - 44 U/L   Alkaline Phosphatase 101 38 - 126 U/L   Total Bilirubin 0.4 0.3 - 1.2 mg/dL   GFR calc non Af Amer >60 >60 mL/min   GFR calc Af Amer >60 >60 mL/min   Anion gap 10 5 - 15  CBC     Status: Abnormal   Collection Time: 01/27/20  2:32 PM  Result Value Ref Range   WBC 8.0 4.0 - 10.5 K/uL   RBC 3.76 (L) 3.87 - 5.11 MIL/uL   Hemoglobin 10.4 (L) 12.0 - 15.0 g/dL   HCT 32.0 (L) 36.0 - 46.0 %   MCV 85.1 80.0 - 100.0 fL   MCH 27.7 26.0 - 34.0 pg   MCHC 32.5 30.0 - 36.0 g/dL  RDW 13.9 11.5 - 15.5 %   Platelets 272 150 - 400 K/uL   nRBC 0.0 0.0 - 0.2 %   MAU Course  Procedures  MDM Labs ordered and reviewed. HA improved after med. No evidence of PEC. Will plan for IOL at 37 wks for gHTN and AT. Informed pt of plan and agrees. Stable for discharge home.   Assessment and Plan  [redacted] weeks gestation Reactive NST Gestational HTN  Discharge home Follow up at [facility] next week-  message sent PEC precautions Tylenol prn  Allergies as of 01/27/2020      Reactions   Feraheme [ferumoxytol]    Chest pain, SOB, diaphoretic, and nausea      Medication List    TAKE these medications   Blood Pressure Kit Devi 1 Device by Does not apply route daily. ICD 10: Z34.00   PrePLUS 27-1 MG Tabs Take 1 tablet by mouth daily.      Julianne Handler, CNM 01/27/2020, 4:30 PM

## 2020-01-27 NOTE — Discharge Instructions (Signed)

## 2020-01-28 ENCOUNTER — Inpatient Hospital Stay (HOSPITAL_COMMUNITY)
Admission: AD | Admit: 2020-01-28 | Discharge: 2020-02-02 | DRG: 807 | Disposition: A | Discharge status: Home / Self Care | Payer: Medicaid Other | Attending: Obstetrics and Gynecology | Admitting: Obstetrics and Gynecology

## 2020-01-28 ENCOUNTER — Other Ambulatory Visit: Payer: Self-pay

## 2020-01-28 ENCOUNTER — Encounter (HOSPITAL_COMMUNITY): Payer: Self-pay | Admitting: Obstetrics & Gynecology

## 2020-01-28 ENCOUNTER — Encounter: Payer: Self-pay | Admitting: *Deleted

## 2020-01-28 DIAGNOSIS — Z20822 Contact with and (suspected) exposure to covid-19: Secondary | ICD-10-CM | POA: Diagnosis present

## 2020-01-28 DIAGNOSIS — O1493 Unspecified pre-eclampsia, third trimester: Secondary | ICD-10-CM

## 2020-01-28 DIAGNOSIS — O1414 Severe pre-eclampsia complicating childbirth: Principal | ICD-10-CM | POA: Diagnosis present

## 2020-01-28 DIAGNOSIS — O9902 Anemia complicating childbirth: Secondary | ICD-10-CM | POA: Diagnosis present

## 2020-01-28 DIAGNOSIS — Z3493 Encounter for supervision of normal pregnancy, unspecified, third trimester: Secondary | ICD-10-CM

## 2020-01-28 DIAGNOSIS — O99019 Anemia complicating pregnancy, unspecified trimester: Secondary | ICD-10-CM | POA: Diagnosis present

## 2020-01-28 DIAGNOSIS — O99013 Anemia complicating pregnancy, third trimester: Secondary | ICD-10-CM

## 2020-01-28 DIAGNOSIS — Z3A35 35 weeks gestation of pregnancy: Secondary | ICD-10-CM

## 2020-01-28 DIAGNOSIS — D649 Anemia, unspecified: Secondary | ICD-10-CM | POA: Diagnosis present

## 2020-01-28 DIAGNOSIS — Z3490 Encounter for supervision of normal pregnancy, unspecified, unspecified trimester: Secondary | ICD-10-CM

## 2020-01-28 DIAGNOSIS — O1410 Severe pre-eclampsia, unspecified trimester: Secondary | ICD-10-CM | POA: Diagnosis present

## 2020-01-28 HISTORY — DX: Unspecified pre-eclampsia, unspecified trimester: O14.90

## 2020-01-28 LAB — CBC
HCT: 32.2 % — ABNORMAL LOW (ref 36.0–46.0)
Hemoglobin: 10.5 g/dL — ABNORMAL LOW (ref 12.0–15.0)
MCH: 27.4 pg (ref 26.0–34.0)
MCHC: 32.6 g/dL (ref 30.0–36.0)
MCV: 84.1 fL (ref 80.0–100.0)
Platelets: 277 10*3/uL (ref 150–400)
RBC: 3.83 MIL/uL — ABNORMAL LOW (ref 3.87–5.11)
RDW: 13.8 % (ref 11.5–15.5)
WBC: 9.9 10*3/uL (ref 4.0–10.5)
nRBC: 0 % (ref 0.0–0.2)

## 2020-01-28 LAB — URINALYSIS, ROUTINE W REFLEX MICROSCOPIC
Bilirubin Urine: NEGATIVE
Glucose, UA: NEGATIVE mg/dL
Hgb urine dipstick: NEGATIVE
Ketones, ur: 20 mg/dL — AB
Leukocytes,Ua: NEGATIVE
Nitrite: NEGATIVE
Protein, ur: NEGATIVE mg/dL
Specific Gravity, Urine: 1.01 (ref 1.005–1.030)
pH: 7 (ref 5.0–8.0)

## 2020-01-28 MED ORDER — LABETALOL HCL 5 MG/ML IV SOLN: 80.0000 mg | INTRAVENOUS | Status: DC | PRN

## 2020-01-28 MED ORDER — LACTATED RINGERS IV SOLN: INTRAVENOUS | Status: DC

## 2020-01-28 MED ORDER — LABETALOL HCL 5 MG/ML IV SOLN
20.0000 mg | INTRAVENOUS | Status: DC | PRN
  Administered 2020-01-29: 20 mg via INTRAVENOUS
  Filled 2020-01-28: qty 4

## 2020-01-28 MED ORDER — HYDRALAZINE HCL 20 MG/ML IJ SOLN: 10.0000 mg | INTRAMUSCULAR | Status: DC | PRN

## 2020-01-28 MED ORDER — LABETALOL HCL 5 MG/ML IV SOLN: 40.0000 mg | INTRAVENOUS | Status: DC | PRN

## 2020-01-28 NOTE — MAU Note (Signed)
Pt here with complaints of HA and elevated BP. Was seen here last night for same problem, but HA was not as bad then. Reports HA is 10/10. BP was 160/111 at home. Pt reports taking 1 regular Tylenol around 1800. Pt denies vision changes or RUQ pain. Pt denies LOF, vaginal bleeding or contractions. Reports good fetal movement.

## 2020-01-29 ENCOUNTER — Encounter (HOSPITAL_COMMUNITY): Payer: Self-pay | Admitting: Obstetrics & Gynecology

## 2020-01-29 DIAGNOSIS — Z362 Encounter for other antenatal screening follow-up: Secondary | ICD-10-CM | POA: Diagnosis not present

## 2020-01-29 DIAGNOSIS — O1494 Unspecified pre-eclampsia, complicating childbirth: Secondary | ICD-10-CM | POA: Diagnosis not present

## 2020-01-29 DIAGNOSIS — Z3A35 35 weeks gestation of pregnancy: Secondary | ICD-10-CM

## 2020-01-29 DIAGNOSIS — O134 Gestational [pregnancy-induced] hypertension without significant proteinuria, complicating childbirth: Secondary | ICD-10-CM | POA: Diagnosis not present

## 2020-01-29 DIAGNOSIS — Z20822 Contact with and (suspected) exposure to covid-19: Secondary | ICD-10-CM | POA: Diagnosis present

## 2020-01-29 DIAGNOSIS — O9902 Anemia complicating childbirth: Secondary | ICD-10-CM | POA: Diagnosis present

## 2020-01-29 DIAGNOSIS — O1410 Severe pre-eclampsia, unspecified trimester: Secondary | ICD-10-CM | POA: Diagnosis present

## 2020-01-29 DIAGNOSIS — O1414 Severe pre-eclampsia complicating childbirth: Secondary | ICD-10-CM | POA: Diagnosis present

## 2020-01-29 DIAGNOSIS — D649 Anemia, unspecified: Secondary | ICD-10-CM | POA: Diagnosis present

## 2020-01-29 LAB — CBC
HCT: 31.5 % — ABNORMAL LOW (ref 36.0–46.0)
Hemoglobin: 10.2 g/dL — ABNORMAL LOW (ref 12.0–15.0)
MCH: 27.3 pg (ref 26.0–34.0)
MCHC: 32.4 g/dL (ref 30.0–36.0)
MCV: 84.5 fL (ref 80.0–100.0)
Platelets: 262 10*3/uL (ref 150–400)
RBC: 3.73 MIL/uL — ABNORMAL LOW (ref 3.87–5.11)
RDW: 13.6 % (ref 11.5–15.5)
WBC: 9 10*3/uL (ref 4.0–10.5)
nRBC: 0 % (ref 0.0–0.2)

## 2020-01-29 LAB — COMPREHENSIVE METABOLIC PANEL
ALT: 13 U/L (ref 0–44)
ALT: 14 U/L (ref 0–44)
AST: 16 U/L (ref 15–41)
AST: 17 U/L (ref 15–41)
Albumin: 2.8 g/dL — ABNORMAL LOW (ref 3.5–5.0)
Albumin: 2.9 g/dL — ABNORMAL LOW (ref 3.5–5.0)
Alkaline Phosphatase: 110 U/L (ref 38–126)
Alkaline Phosphatase: 113 U/L (ref 38–126)
Anion gap: 10 (ref 5–15)
Anion gap: 11 (ref 5–15)
BUN: 6 mg/dL (ref 6–20)
BUN: 6 mg/dL (ref 6–20)
CO2: 19 mmol/L — ABNORMAL LOW (ref 22–32)
CO2: 20 mmol/L — ABNORMAL LOW (ref 22–32)
Calcium: 8.3 mg/dL — ABNORMAL LOW (ref 8.9–10.3)
Calcium: 9 mg/dL (ref 8.9–10.3)
Chloride: 105 mmol/L (ref 98–111)
Chloride: 107 mmol/L (ref 98–111)
Creatinine, Ser: 0.62 mg/dL (ref 0.44–1.00)
Creatinine, Ser: 0.64 mg/dL (ref 0.44–1.00)
GFR calc Af Amer: >60 mL/min (ref >60)
GFR calc Af Amer: >60 mL/min (ref >60)
GFR calc non Af Amer: >60 mL/min (ref >60)
GFR calc non Af Amer: >60 mL/min (ref >60)
Glucose, Bld: 81 mg/dL (ref 70–99)
Glucose, Bld: 87 mg/dL (ref 70–99)
Potassium: 3.4 mmol/L — ABNORMAL LOW (ref 3.5–5.1)
Potassium: 3.5 mmol/L (ref 3.5–5.1)
Sodium: 135 mmol/L (ref 135–145)
Sodium: 137 mmol/L (ref 135–145)
Total Bilirubin: 0.6 mg/dL (ref 0.3–1.2)
Total Bilirubin: 0.6 mg/dL (ref 0.3–1.2)
Total Protein: 5.7 g/dL — ABNORMAL LOW (ref 6.5–8.1)
Total Protein: 6.1 g/dL — ABNORMAL LOW (ref 6.5–8.1)

## 2020-01-29 LAB — RPR: RPR Ser Ql: NONREACTIVE

## 2020-01-29 LAB — TYPE AND SCREEN
ABO/RH(D): O POS
Antibody Screen: NEGATIVE

## 2020-01-29 LAB — PROTEIN / CREATININE RATIO, URINE
Creatinine, Urine: 57.36 mg/dL
Protein Creatinine Ratio: 0.26 mg/mg{Cre} — ABNORMAL HIGH (ref 0.00–0.15)
Total Protein, Urine: 15 mg/dL

## 2020-01-29 LAB — SARS CORONAVIRUS 2 (TAT 6-24 HRS): SARS Coronavirus 2: NEGATIVE

## 2020-01-29 LAB — ABO/RH: ABO/RH(D): O POS

## 2020-01-29 MED ORDER — BETAMETHASONE SOD PHOS & ACET 6 (3-3) MG/ML IJ SUSP: 12.0000 mg | Freq: Once | INTRAMUSCULAR | Status: DC

## 2020-01-29 MED ORDER — SOD CITRATE-CITRIC ACID 500-334 MG/5ML PO SOLN: 30.0000 mL | ORAL | Status: DC | PRN

## 2020-01-29 MED ORDER — MISOPROSTOL 50MCG HALF TABLET
50.0000 ug | ORAL_TABLET | ORAL | Status: DC | PRN
  Administered 2020-01-29 – 2020-01-30 (×4): 50 ug via ORAL
  Filled 2020-01-29 (×3): qty 1

## 2020-01-29 MED ORDER — MAGNESIUM SULFATE 40 GM/1000ML IV SOLN
1.0000 g/h | INTRAVENOUS | Status: DC
  Administered 2020-01-29 – 2020-01-30 (×2): 1 g/h via INTRAVENOUS
  Filled 2020-01-29 (×2): qty 1000

## 2020-01-29 MED ORDER — BUTALBITAL-APAP-CAFFEINE 50-325-40 MG PO TABS
2.0000 | ORAL_TABLET | Freq: Once | ORAL | Status: AC
  Administered 2020-01-29: 2 via ORAL
  Filled 2020-01-29: qty 2

## 2020-01-29 MED ORDER — OXYTOCIN BOLUS FROM INFUSION
500.0000 mL | Freq: Once | INTRAVENOUS | Status: AC
  Administered 2020-01-31: 500 mL via INTRAVENOUS

## 2020-01-29 MED ORDER — OXYTOCIN 40 UNITS IN NORMAL SALINE INFUSION - SIMPLE MED: 2.5000 [IU]/h | INTRAVENOUS | Status: DC

## 2020-01-29 MED ORDER — ONDANSETRON HCL 4 MG/2ML IJ SOLN
4.0000 mg | Freq: Four times a day (QID) | INTRAMUSCULAR | Status: DC | PRN
  Administered 2020-01-29 – 2020-01-30 (×3): 4 mg via INTRAVENOUS
  Filled 2020-01-29 (×3): qty 2

## 2020-01-29 MED ORDER — SODIUM CHLORIDE 0.9 % IV SOLN
5.0000 10*6.[IU] | Freq: Once | INTRAVENOUS | Status: AC
  Administered 2020-01-29: 5 10*6.[IU] via INTRAVENOUS
  Filled 2020-01-29: qty 5

## 2020-01-29 MED ORDER — FENTANYL CITRATE (PF) 100 MCG/2ML IJ SOLN
50.0000 ug | INTRAMUSCULAR | Status: DC | PRN
  Administered 2020-01-30: 50 ug via INTRAVENOUS
  Filled 2020-01-29 (×2): qty 2

## 2020-01-29 MED ORDER — ACETAMINOPHEN 325 MG PO TABS
650.0000 mg | ORAL_TABLET | ORAL | Status: DC | PRN
  Administered 2020-01-30 (×2): 650 mg via ORAL
  Filled 2020-01-29 (×2): qty 2

## 2020-01-29 MED ORDER — MAGNESIUM SULFATE BOLUS VIA INFUSION
4.0000 g | Freq: Once | INTRAVENOUS | Status: AC
  Administered 2020-01-29: 4 g via INTRAVENOUS
  Filled 2020-01-29: qty 1000

## 2020-01-29 MED ORDER — ZOLPIDEM TARTRATE 5 MG PO TABS: 5.0000 mg | ORAL_TABLET | Freq: Every evening | ORAL | Status: DC | PRN

## 2020-01-29 MED ORDER — ACETAMINOPHEN 325 MG PO TABS: 650.0000 mg | ORAL_TABLET | ORAL | Status: DC | PRN

## 2020-01-29 MED ORDER — POTASSIUM CHLORIDE CRYS ER 20 MEQ PO TBCR
20.0000 meq | EXTENDED_RELEASE_TABLET | Freq: Two times a day (BID) | ORAL | Status: DC
  Administered 2020-01-29 (×2): 20 meq via ORAL
  Filled 2020-01-29 (×5): qty 1

## 2020-01-29 MED ORDER — LIDOCAINE HCL (PF) 1 % IJ SOLN: 30.0000 mL | INTRAMUSCULAR | Status: DC | PRN

## 2020-01-29 MED ORDER — HYDRALAZINE HCL 20 MG/ML IJ SOLN
5.0000 mg | INTRAMUSCULAR | Status: DC | PRN
  Administered 2020-01-31: 5 mg via INTRAVENOUS
  Filled 2020-01-29: qty 1

## 2020-01-29 MED ORDER — HYDRALAZINE HCL 20 MG/ML IJ SOLN
10.0000 mg | INTRAMUSCULAR | Status: DC | PRN
  Administered 2020-01-31: 10 mg via INTRAVENOUS

## 2020-01-29 MED ORDER — LACTATED RINGERS IV SOLN: 500.0000 mL | INTRAVENOUS | Status: DC | PRN

## 2020-01-29 MED ORDER — CALCIUM CARBONATE ANTACID 500 MG PO CHEW: 2.0000 | CHEWABLE_TABLET | ORAL | Status: DC | PRN

## 2020-01-29 MED ORDER — LACTATED RINGERS IV SOLN: INTRAVENOUS | Status: DC

## 2020-01-29 MED ORDER — MISOPROSTOL 50MCG HALF TABLET
ORAL_TABLET | ORAL | Status: AC
  Filled 2020-01-29: qty 1

## 2020-01-29 MED ORDER — PENICILLIN G POT IN DEXTROSE 60000 UNIT/ML IV SOLN
3.0000 10*6.[IU] | INTRAVENOUS | Status: DC
  Administered 2020-01-29 – 2020-01-31 (×8): 3 10*6.[IU] via INTRAVENOUS
  Filled 2020-01-29 (×8): qty 50

## 2020-01-29 MED ORDER — LABETALOL HCL 5 MG/ML IV SOLN: 20.0000 mg | INTRAVENOUS | Status: DC | PRN

## 2020-01-29 MED ORDER — TERBUTALINE SULFATE 1 MG/ML IJ SOLN: 0.2500 mg | Freq: Once | INTRAMUSCULAR | Status: DC | PRN

## 2020-01-29 MED ORDER — LABETALOL HCL 5 MG/ML IV SOLN
40.0000 mg | INTRAVENOUS | Status: DC | PRN
  Filled 2020-01-29: qty 8

## 2020-01-29 MED ORDER — DOCUSATE SODIUM 100 MG PO CAPS: 100.0000 mg | ORAL_CAPSULE | Freq: Every day | ORAL | Status: DC

## 2020-01-29 MED ORDER — BETAMETHASONE SOD PHOS & ACET 6 (3-3) MG/ML IJ SUSP
12.0000 mg | INTRAMUSCULAR | Status: AC
  Administered 2020-01-29 (×2): 12 mg via INTRAMUSCULAR
  Filled 2020-01-29 (×2): qty 5

## 2020-01-29 NOTE — Progress Notes (Signed)
Labor Progress Note Christina Velazquez is a 24 y.o. G1P0 at [redacted]w[redacted]d presented for IOL for preeclampsia with severe features  S:  Patient comfortable. Denies HA, visual changes or epigastric pain  O:  BP 138/87   Pulse (!) 103   Temp 98.6 F (37 C) (Oral)   Resp 16   Ht 4\' 11"  (1.499 m)   Wt 66.7 kg   LMP 03/25/2019 (Exact Date)   SpO2 99%   BMI 29.69 kg/m   Fetal Tracing:  Baseline: 120 Variability: moderate Accels: 10x10 Decels: none  Toco: none   CVE: Dilation: Closed Effacement (%): 50 Station: -2 Presentation: Vertex(per U/S) Exam by:: [email or web address], CNM   A&P: 24 y.o. G1P0 [redacted]w[redacted]d IOL preeclampsia with severe features #Labor: Will start with cytotec and plan FB when able #Pain: per patient request #FWB: Cat 1 #GBS pending, PCN  [redacted]w[redacted]d, CNM 4:24 PM

## 2020-01-29 NOTE — H&P (Signed)
[facility]TEPART[facility] ADMISSION HISTORY AND PHYSICAL NOTE   History of Present Illness: Christina Velazquez is a 24 y.o. G1P0 at 55w2dadmitted for preeclampsia. She was seen in MAU yesterday and diagnosed with gestational hypertension. Returned this evening for severe headache. Had severe range pressures treated with IV labetalol. Denies history of hypertension.  Patient reports the fetal movement as active. Patient reports uterine contraction  activity as none. Patient reports  vaginal bleeding as none. Patient describes fluid per vagina as None. Fetal presentation is cephalic.  Patient Active Problem List   Diagnosis Date Noted  . Preeclampsia 01/29/2020  . Gestational hypertension 01/27/2020  . Anemia in pregnancy 12/08/2019  . Supervision of low-risk pregnancy 07/14/2019    Past Medical History:  Diagnosis Date  . Anemia     Past Surgical History:  Procedure Laterality Date  . NO PAST SURGERIES      OB History  Gravida Para Term Preterm AB Living  1         0  SAB TAB Ectopic Multiple Live Births               # Outcome Date GA Lbr Len/2nd Weight Sex Delivery Anes PTL Lv  1 Current             Social History   Socioeconomic History  . Marital status: Single    Spouse name: Not on file  . Number of children: Not on file  . Years of education: Not on file  . Highest education level: Not on file  Occupational History  . Not on file  Tobacco Use  . Smoking status: Never Smoker  . Smokeless tobacco: Never Used  Substance and Sexual Activity  . Alcohol use: Not Currently    Comment: social drinking  . Drug use: No  . Sexual activity: Yes    Birth control/protection: None  Other Topics Concern  . Not on file  Social History Narrative  . Not on file   Social Determinants of Health   Financial Resource Strain:   . Difficulty of Paying Living Expenses: Not on file  Food Insecurity: No Food Insecurity  . Worried About RCharity fundraiserin the Last Year:  Never true  . Ran Out of Food in the Last Year: Never true  Transportation Needs: No Transportation Needs  . Lack of Transportation (Medical): No  . Lack of Transportation (Non-Medical): No  Physical Activity:   . Days of Exercise per Week: Not on file  . Minutes of Exercise per Session: Not on file  Stress:   . Feeling of Stress : Not on file  Social Connections:   . Frequency of Communication with Friends and Family: Not on file  . Frequency of Social Gatherings with Friends and Family: Not on file  . Attends Religious Services: Not on file  . Active Member of Clubs or Organizations: Not on file  . Attends CArchivistMeetings: Not on file  . Marital Status: Not on file    Family History  Problem Relation Age of Onset  . Stroke Father     Allergies  Allergen Reactions  . Feraheme [Ferumoxytol]     Chest pain, SOB, diaphoretic, and nausea    Facility-Administered Medications Prior to Admission  Medication Dose Route Frequency Provider Last Rate Last Admin  . iron polysaccharides (NIFEREX) capsule 150 mg  150 mg Oral Daily Burleson, Terri L, NP       Medications Prior to Admission  Medication Sig  Dispense Refill Last Dose  . Prenatal Vit-Fe Fumarate-FA (PREPLUS) 27-1 MG TABS Take 1 tablet by mouth daily. 30 tablet 6 01/28/2020 at Unknown time  . Blood Pressure Monitoring (BLOOD PRESSURE KIT) DEVI 1 Device by Does not apply route daily. ICD 10: Z34.00 1 Device 0     Review of Systems - Negative except headache  Vitals:  BP (!) 150/98   Pulse 88   Temp 98.6 F (37 C) (Oral)   Resp 18   Ht _0  (1.499 m)   Wt 66.7 kg   LMP 03/25/2019 (Exact Date)   SpO2 100%   BMI 29.69 kg/m  Physical Examination: CONSTITUTIONAL: Well-developed, well-nourished female in no acute distress.  HENT:  Normocephalic, atraumatic, External right and left ear normal. Oropharynx is clear and moist EYES: Conjunctivae and EOM are normal. Pupils are equal, round, and reactive to  light. No scleral icterus.  NECK: Normal range of motion, supple, no masses SKIN: Skin is warm and dry. No rash noted. Not diaphoretic. No erythema. No pallor. Bend: Alert and oriented to person, place, and time. Normal reflexes, muscle tone coordination. No cranial nerve deficit noted. PSYCHIATRIC: Normal mood and affect. Normal behavior. Normal judgment and thought content. CARDIOVASCULAR: Normal heart rate noted, regular rhythm RESPIRATORY: Effort and breath sounds normal, no problems with respiration noted ABDOMEN: Soft, nontender, nondistended, gravid. MUSCULOSKELETAL: Normal range of motion. No edema and no tenderness. 2+ distal pulses.  Cervix: Dilation: Closed Effacement (%): 50 Station: -2 Presentation: Vertex(per U/S) Exam by:: E.Rodriguez Aguinaldo,NP Membranes:intact Fetal Monitoring:Baseline: 120 bpm, Variability: Good {> 6 bpm), Accelerations: Reactive and Decelerations: Absent Tocometer: Flat  Labs:  Results for orders placed or performed during the hospital encounter of 01/28/20 (from the past 24 hour(s))  CBC   Collection Time: 01/28/20 11:37 PM  Result Value Ref Range   WBC 9.9 4.0 - 10.5 K/uL   RBC 3.83 (L) 3.87 - 5.11 MIL/uL   Hemoglobin 10.5 (L) 12.0 - 15.0 g/dL   HCT 32.2 (L) 36.0 - 46.0 %   MCV 84.1 80.0 - 100.0 fL   MCH 27.4 26.0 - 34.0 pg   MCHC 32.6 30.0 - 36.0 g/dL   RDW 13.8 11.5 - 15.5 %   Platelets 277 150 - 400 K/uL   nRBC 0.0 0.0 - 0.2 %  Comprehensive metabolic panel   Collection Time: 01/28/20 11:37 PM  Result Value Ref Range   Sodium 137 135 - 145 mmol/L   Potassium 3.5 3.5 - 5.1 mmol/L   Chloride 107 98 - 111 mmol/L   CO2 20 (L) 22 - 32 mmol/L   Glucose, Bld 81 70 - 99 mg/dL   BUN 6 6 - 20 mg/dL   Creatinine, Ser 0.62 0.44 - 1.00 mg/dL   Calcium 9.0 8.9 - 10.3 mg/dL   Total Protein 6.1 (L) 6.5 - 8.1 g/dL   Albumin 2.9 (L) 3.5 - 5.0 g/dL   AST 16 15 - 41 U/L   ALT 14 0 - 44 U/L   Alkaline Phosphatase 113 38 - 126 U/L   Total Bilirubin 0.6  0.3 - 1.2 mg/dL   GFR calc non Af Amer >60 >60 mL/min   GFR calc Af Amer >60 >60 mL/min   Anion gap 10 5 - 15  Urinalysis, Routine w reflex microscopic   Collection Time: 01/28/20 11:41 PM  Result Value Ref Range   Color, Urine STRAW (A) YELLOW   APPearance CLEAR CLEAR   Specific Gravity, Urine 1.010 1.005 - 1.030   pH  7.0 5.0 - 8.0   Glucose, UA NEGATIVE NEGATIVE mg/dL   Hgb urine dipstick NEGATIVE NEGATIVE   Bilirubin Urine NEGATIVE NEGATIVE   Ketones, ur 20 (A) NEGATIVE mg/dL   Protein, ur NEGATIVE NEGATIVE mg/dL   Nitrite NEGATIVE NEGATIVE   Leukocytes,Ua NEGATIVE NEGATIVE    Imaging Studies: No results found.   Assessment and Plan: 1. Pre-eclampsia in third trimester   2. [redacted] weeks gestation of pregnancy   -admit to [facility] unit -GBS culture pending -COVID swab pending -BMZ given in MAU -Mag sulfate for seizure prophylaxis  Jorje Guild, NP 01/29/2020 12:48 AM

## 2020-01-29 NOTE — Plan of Care (Signed)
  Problem: Education: Goal: Knowledge of disease or condition will improve Outcome: Progressing Goal: Knowledge of the prescribed therapeutic regimen will improve Outcome: Progressing   Problem: Health Behavior/Discharge Planning: Goal: Ability to manage health-related needs will improve Outcome: Progressing

## 2020-01-29 NOTE — Progress Notes (Signed)
OB Note  D/w her re: situation and I recommend going ahead with IOL and not waiting on 2nd dose of BMZ which she is amenable to. Pt currently asymptomatic (HA has resolved). Pt amenable to moving forward with IOL. She has FOB on way and should be here around 1700-1800 today and would like to wait to start until then which I'm fine with. Pt told if she becomes symptomatic to let us know  Cornelia Copa MD [facility]ending [facility] [employer] (Faculty Practice) 01/29/2020 Time: [phone]

## 2020-01-29 NOTE — Progress Notes (Signed)
Pt informed that the ultrasound is considered a limited OB ultrasound and is not intended to be a complete ultrasound exam.  Patient also informed that the ultrasound is not being completed with the intent of assessing for fetal or placental anomalies or any pelvic abnormalities.  Explained that the purpose of today's ultrasound is to assess for  presentation.  Patient acknowledges the purpose of the exam and the limitations of the study.    Cephalic  Bary Limbach, NP   

## 2020-01-29 NOTE — Progress Notes (Signed)
Christina Velazquez is a 24 y.o. G1P0 at [redacted]w[redacted]d admitted for IOL for preeclampsia with severe features  Subjective: Feeling baby move. Was feeling some mild cramping, no painful contractions. No HA at this time. No visual changes or RUQ pain.  Objective: BP (!) 150/92   Pulse (!) 103   Temp 98.6 F (37 C) (Oral)   Resp 16   Ht 4\' 11"  (1.499 m)   Wt 66.7 kg   LMP 03/25/2019 (Exact Date)   SpO2 99%   BMI 29.69 kg/m  Total I/O In: 475 [P.O.:175; I.V.:300] Out: 475 [Urine:475]  FHT:  FHR: 120 bpm, variability: moderate,  accelerations:  Present,  decelerations:  Absent UC:   irregular, every 2-5 minutes  SVE:   Dilation: 1 Effacement (%): 50 Station: -2 Exam by:: Dr. [email or web address]  Labs: Lab Results  Component Value Date   WBC 9.0 01/29/2020   HGB 10.2 (L) 01/29/2020   HCT 31.5 (L) 01/29/2020   MCV 84.5 01/29/2020   PLT 262 01/29/2020    Assessment / Plan: 24 y.o. G1P0 [redacted]w[redacted]d here for IOL preeclampsia with severe features  #Labor: FB placed at this check without difficulty. 2nd dose of cytotec given. Continue with cytotec until FB out, consider Pitocin when appropriate. #Pain: per patient request #FWB: Cat 1 #GBS pending, PCN #Pre-E w/ SF (HA and severe range BP): currently on Mag, no signs of toxicity. BP mild range, most recent 150/92. Continue to monitor.  [redacted]w[redacted]d DO OB Fellow, Faculty Practice 01/29/2020, 8:40 PM

## 2020-01-30 ENCOUNTER — Encounter (HOSPITAL_COMMUNITY): Payer: Self-pay | Admitting: Obstetrics & Gynecology

## 2020-01-30 LAB — COMPREHENSIVE METABOLIC PANEL
ALT: 17 U/L (ref 0–44)
AST: 21 U/L (ref 15–41)
Albumin: 2.9 g/dL — ABNORMAL LOW (ref 3.5–5.0)
Alkaline Phosphatase: 114 U/L (ref 38–126)
Anion gap: 10 (ref 5–15)
BUN: <5 mg/dL — ABNORMAL LOW (ref 6–20)
CO2: 20 mmol/L — ABNORMAL LOW (ref 22–32)
Calcium: 7.7 mg/dL — ABNORMAL LOW (ref 8.9–10.3)
Chloride: 107 mmol/L (ref 98–111)
Creatinine, Ser: 0.81 mg/dL (ref 0.44–1.00)
GFR calc Af Amer: >60 mL/min (ref >60)
GFR calc non Af Amer: >60 mL/min (ref >60)
Glucose, Bld: 116 mg/dL — ABNORMAL HIGH (ref 70–99)
Potassium: 4.3 mmol/L (ref 3.5–5.1)
Sodium: 137 mmol/L (ref 135–145)
Total Bilirubin: 0.4 mg/dL (ref 0.3–1.2)
Total Protein: 6.3 g/dL — ABNORMAL LOW (ref 6.5–8.1)

## 2020-01-30 LAB — CBC
HCT: 34.1 % — ABNORMAL LOW (ref 36.0–46.0)
Hemoglobin: 11 g/dL — ABNORMAL LOW (ref 12.0–15.0)
MCH: 27.1 pg (ref 26.0–34.0)
MCHC: 32.3 g/dL (ref 30.0–36.0)
MCV: 84 fL (ref 80.0–100.0)
Platelets: 318 10*3/uL (ref 150–400)
RBC: 4.06 MIL/uL (ref 3.87–5.11)
RDW: 13.8 % (ref 11.5–15.5)
WBC: 14.8 10*3/uL — ABNORMAL HIGH (ref 4.0–10.5)
nRBC: 0 % (ref 0.0–0.2)

## 2020-01-30 LAB — MAGNESIUM: Magnesium: 5 mg/dL — ABNORMAL HIGH (ref 1.7–2.4)

## 2020-01-30 MED ORDER — MISOPROSTOL 25 MCG QUARTER TABLET
ORAL_TABLET | ORAL | Status: AC
  Administered 2020-01-30: 25 ug via VAGINAL
  Filled 2020-01-30: qty 1

## 2020-01-30 MED ORDER — BUTALBITAL-APAP-CAFFEINE 50-325-40 MG PO TABS
2.0000 | ORAL_TABLET | Freq: Once | ORAL | Status: AC
  Administered 2020-01-30: 2 via ORAL
  Filled 2020-01-30: qty 2

## 2020-01-30 MED ORDER — PANTOPRAZOLE SODIUM 40 MG IV SOLR
40.0000 mg | Freq: Every day | INTRAVENOUS | Status: DC
  Administered 2020-01-30 (×2): 40 mg via INTRAVENOUS
  Filled 2020-01-30 (×2): qty 40

## 2020-01-30 MED ORDER — TERBUTALINE SULFATE 1 MG/ML IJ SOLN: 0.2500 mg | Freq: Once | INTRAMUSCULAR | Status: DC | PRN

## 2020-01-30 MED ORDER — OXYTOCIN 40 UNITS IN NORMAL SALINE INFUSION - SIMPLE MED
1.0000 m[IU]/min | INTRAVENOUS | Status: DC
  Administered 2020-01-30: 2 m[IU]/min via INTRAVENOUS
  Filled 2020-01-30: qty 1000

## 2020-01-30 MED ORDER — METOCLOPRAMIDE HCL 5 MG/ML IJ SOLN
10.0000 mg | Freq: Four times a day (QID) | INTRAMUSCULAR | Status: DC | PRN
  Administered 2020-01-30: 10 mg via INTRAVENOUS
  Filled 2020-01-30: qty 2

## 2020-01-30 MED ORDER — FENTANYL CITRATE (PF) 100 MCG/2ML IJ SOLN
100.0000 ug | INTRAMUSCULAR | Status: DC | PRN
  Administered 2020-01-30 – 2020-01-31 (×2): 100 ug via INTRAVENOUS
  Filled 2020-01-30: qty 2

## 2020-01-30 MED ORDER — MISOPROSTOL 25 MCG QUARTER TABLET
25.0000 ug | ORAL_TABLET | ORAL | Status: DC | PRN
  Administered 2020-01-30: 25 ug via VAGINAL
  Filled 2020-01-30: qty 1

## 2020-01-30 NOTE — Progress Notes (Signed)
Labor Progress Note Pegge Cumberledge is a 24 y.o. G1P0 at [redacted]w[redacted]d presented for IOL for preeclampsia with severe features  S:  Patient denies any nausea, she is feeling occasional pain with her contractions in the lower abdomen.  She is very hungry and is asking for food.  O:  BP 130/84   Pulse 83   Temp (!) 97.5 F (36.4 C) (Oral)   Resp 18   Ht 4\' 11"  (1.499 m)   Wt 66.7 kg   LMP 03/25/2019 (Exact Date)   SpO2 99%   BMI 29.69 kg/m   Fetal Tracing:  Baseline: 115 Variability: moderate Accels: 15x15 Decels: none  Toco: occasional uc's   CVE: Dilation: 3.5 Effacement (%): 30% Cervical Position: Posterior Station: -3 Presentation: Vertex Exam by:: [email or web address]   A&P: 24 y.o. G1P0 [redacted]w[redacted]d IOL for preeclampsia with severe features #Labor: SROM with clear fluid. Continue cytotec at this time. #PreE w/ Severe Features: No headache or severe blood pressures at this time. Magnesium started 0156 on 01/29/2020. #Pain: per patient request #FWB: Cat 1 #GBS unknown, PCN  03/28/2020, DO

## 2020-01-30 NOTE — Progress Notes (Signed)
Labor Progress Note Christina Velazquez is a 24 y.o. G1P0 at [redacted]w[redacted]d presented for IOL for preeclampsia with severe features  S:  Patient comfortable. Denies HA, visual changes or epigastric pain.  O:  BP (!) 146/93   Pulse 85   Temp 97.6 F (36.4 C) (Oral)   Resp 18   Ht 4\' 11"  (1.499 m)   Wt 66.7 kg   LMP 03/25/2019 (Exact Date)   SpO2 99%   BMI 29.69 kg/m   Fetal Tracing:  Baseline: 115 Variability: moderate Accels: 15x15  Decels: 1 variable  Toco: occasional uc's   CVE: Dilation: 4.5 Effacement (%): 70 Cervical Position: Posterior Station: -2 Presentation: Vertex Exam by:: [email or web address], CNM    A&P: 24 y.o. G1P0 [redacted]w[redacted]d IOL preeclampsia with severe features #Labor: Progressing well. Will start pitocin 2x2 after patient eats dinner #Pain: per patient request #FWB: Cat 1 #GBS unknown-PCN   [redacted]w[redacted]d, CNM 6:34 PM

## 2020-01-30 NOTE — Progress Notes (Signed)
Kaitelyn Jamison is a 24 y.o. G1P0 at [redacted]w[redacted]d admitted for IOL for preeclampsia with severe features  Subjective: Patient is having cramping from FB in lower vaginal area, very uncomfortable.  Objective: BP (!) 150/96   Pulse 88   Temp 97.9 F (36.6 C) (Axillary)   Resp 16   Ht 4\' 11"  (1.499 m)   Wt 66.7 kg   LMP 03/25/2019 (Exact Date)   SpO2 99%   BMI 29.69 kg/m  Total I/O In: 1243.6 [P.O.:300; I.V.:893.6; IV Piggyback:50] Out: 1090 [Urine:1090]  FHT:  FHR: 120 bpm, accels absent but moderate variability, no decels UC:   irregular, every 2-5 minutes  SVE:   Dilation: 1 Effacement (%): 50 Station: -2 Exam by:: Dr. [email or web address]  Labs: Lab Results  Component Value Date   WBC 9.0 01/29/2020   HGB 10.2 (L) 01/29/2020   HCT 31.5 (L) 01/29/2020   MCV 84.5 01/29/2020   PLT 262 01/29/2020    Assessment / Plan: 24 y.o. G1P0 [redacted]w[redacted]d here for IOL preeclampsia with severe features  #Labor: FB still in place. Patient to receive third dose of cytotec via buccal route now. Plan to reassess after FB out. #Pain: per patient request #FWB: Cat 1 #GBS pending, PCN #Pre-E w/ SF (HA and severe range BP): currently on Mag, no signs of toxicity. BP mild range, most recent 123/81. Continue to monitor. HA improved with fioricet.  [redacted]w[redacted]d, MD [facility], PGY-1  01/30/2020, 12:52 AM

## 2020-01-30 NOTE — Progress Notes (Signed)
LABOR PROGRESS NOTE  Christina Velazquez is a 24 y.o. Velazquez at [redacted]w[redacted]d  admitted for IOL for PreE w SF (BP, HA).  Subjective: Starting to have very painful contractions Feeling nauseous  Objective: BP 139/75   Pulse 86   Temp 98.1 F (36.7 C) (Oral)   Resp 18   Ht 4\' 11"  (1.499 m)   Wt 66.7 kg   LMP 03/25/2019 (Exact Date)   SpO2 100%   BMI 29.69 kg/m  or  Vitals:   01/30/20 2244 01/30/20 2245 01/30/20 2301 01/30/20 2305  BP:   139/75   Pulse:   86   Resp:   18   Temp:      TempSrc:      SpO2: 100% 100%  100%  Weight:      Height:         Dilation: 4.5 Effacement (%): 60, 70 Cervical Position: Posterior Station: -2 Presentation: Vertex Exam by:: Dr. [email or web address] FHT: baseline rate 105-110, moderate varibility, +acel, -decel Toco: q2-3 min per RN  Labs: Lab Results  Component Value Date   WBC 14.8 (H) 01/30/2020   HGB 11.0 (L) 01/30/2020   HCT 34.1 (L) 01/30/2020   MCV 84.0 01/30/2020   PLT 318 01/30/2020    Patient Active Problem List   Diagnosis Date Noted  . Preeclampsia 01/29/2020  . Gestational hypertension 01/27/2020  . Anemia in pregnancy 12/08/2019  . Supervision of low-risk pregnancy 07/14/2019    Assessment / Plan: Christina Velazquez at [redacted]w[redacted]d here for IOL for PreE w SF (BP, HA).  Labor: s/p miso x6 and FB, started on pitocin @2046 . Cervix now more dilated and significantly more effaced, cont pitocin Fetal Wellbeing:  Cat II for periods of low baseline but overall reassuring w moderate variability and accels Pain Control:  IV pain meds PRN, open to epidural GBS: unknown on penicillin Anticipated MOD:  SVD  PreE w SF: severe range BP's on admission now normal, on Mg  PTD: s/p BMZ x2  [redacted]w[redacted]d, MD/[facility] OB Fellow  01/30/2020, 11:42 PM

## 2020-01-30 NOTE — Progress Notes (Signed)
Labor Progress Note [name] is a 24 y.o. G1P0 at [redacted]w[redacted]d presented for IOL for preeclampsia with severe features  S:  Patient reports SROM around 0900 with clear fluid.  O:  BP 130/84   Pulse 83   Temp (!) 97.5 F (36.4 C) (Oral)   Resp 18   Ht 4\' 11"  (1.499 m)   Wt 66.7 kg   LMP 03/25/2019 (Exact Date)   SpO2 99%   BMI 29.69 kg/m   Fetal Tracing:  Baseline: 115 Variability: moderate Accels: 15x15 Decels: none  Toco: occasional uc's   CVE: Dilation: 3 Effacement (%): Thick Cervical Position: Posterior Station: -3 Presentation: Vertex Exam by:: [email or web address], CNM    A&P: 24 y.o. G1P0 [redacted]w[redacted]d IOL preeclampsia with severe features #Labor: SROM with clear fluid. Continue cytotec at this time. #Pain: per patient request #FWB: Cat 1 #GBS unknown, PCN  [redacted]w[redacted]d, CNM

## 2020-01-30 NOTE — Progress Notes (Signed)
Christina Velazquez is a 24 y.o. G1P0 at [redacted]w[redacted]d admitted for IOL for preeclampsia with severe features  Subjective: FB came out, patient progressing well. Having some nausea.  Objective: BP 132/88   Pulse 93   Temp 98 F (36.7 C) (Axillary)   Resp 16   Ht 4\' 11"  (1.499 m)   Wt 66.7 kg   LMP 03/25/2019 (Exact Date)   SpO2 99%   BMI 29.69 kg/m  Total I/O In: 1823.6 [P.O.:880; I.V.:893.6; IV Piggyback:50] Out: 1790 [Urine:1790]  FHT:  FHR: 120 bpm, accels absent but moderate variability, 1 variable that returned to baseline quickly UC:   irregular, every 2-5 minutes  SVE:   Dilation: 4.5 Effacement (%): 60 Station: -2 Exam by:: Dr. [email or web address]  Labs: Lab Results  Component Value Date   WBC 14.8 (H) 01/30/2020   HGB 11.0 (L) 01/30/2020   HCT 34.1 (L) 01/30/2020   MCV 84.0 01/30/2020   PLT 318 01/30/2020    Assessment / Plan: 24 y.o. G1P0 [redacted]w[redacted]d here for IOL preeclampsia with severe features  #Labor: FB removed at 0118, good progress on exam. Bishop score currently 6 for moderately firm cervix. Will give 1 more dose of cytotec now for a total of 4 doses. Hopefully will be able to start pitocin at next check. #Pain: per patient request #FWB: Cat 1 #GBS pending, PCN adequate ppx #Pre-E w/ SF (HA and severe range BP): currently on Mag, no signs of toxicity. BP mild range, most recent 132/88. Continue to monitor. HA improved with fioricet. #Preterm: 2nd dose of BMZ given at 2356 on 2/6.  [redacted]w[redacted]d, MD [facility], PGY-1  01/30/2020, 4:55 AM

## 2020-01-31 ENCOUNTER — Encounter: Payer: Self-pay | Admitting: *Deleted

## 2020-01-31 ENCOUNTER — Inpatient Hospital Stay (HOSPITAL_COMMUNITY): Payer: Medicaid Other | Admitting: Anesthesiology

## 2020-01-31 ENCOUNTER — Encounter (HOSPITAL_COMMUNITY): Payer: Self-pay | Admitting: Obstetrics & Gynecology

## 2020-01-31 DIAGNOSIS — Z3A35 35 weeks gestation of pregnancy: Secondary | ICD-10-CM

## 2020-01-31 DIAGNOSIS — O1494 Unspecified pre-eclampsia, complicating childbirth: Secondary | ICD-10-CM

## 2020-01-31 DIAGNOSIS — O134 Gestational [pregnancy-induced] hypertension without significant proteinuria, complicating childbirth: Secondary | ICD-10-CM

## 2020-01-31 LAB — CBC
HCT: 33.9 % — ABNORMAL LOW (ref 36.0–46.0)
Hemoglobin: 11 g/dL — ABNORMAL LOW (ref 12.0–15.0)
MCH: 27.2 pg (ref 26.0–34.0)
MCHC: 32.4 g/dL (ref 30.0–36.0)
MCV: 83.9 fL (ref 80.0–100.0)
Platelets: 333 10*3/uL (ref 150–400)
RBC: 4.04 MIL/uL (ref 3.87–5.11)
RDW: 14.1 % (ref 11.5–15.5)
WBC: 15.8 10*3/uL — ABNORMAL HIGH (ref 4.0–10.5)
nRBC: 0.1 % (ref 0.0–0.2)

## 2020-01-31 MED ORDER — PRENATAL MULTIVITAMIN CH: 1.0000 | ORAL_TABLET | Freq: Every day | ORAL | Status: DC

## 2020-01-31 MED ORDER — BENZOCAINE-MENTHOL 20-0.5 % EX AERO: 1.0000 "application " | INHALATION_SPRAY | CUTANEOUS | Status: DC | PRN

## 2020-01-31 MED ORDER — EPHEDRINE 5 MG/ML INJ: 10.0000 mg | INTRAVENOUS | Status: DC | PRN

## 2020-01-31 MED ORDER — DIPHENHYDRAMINE HCL 50 MG/ML IJ SOLN
12.5000 mg | INTRAMUSCULAR | Status: DC | PRN
  Administered 2020-01-31: 12.5 mg via INTRAVENOUS
  Filled 2020-01-31: qty 1

## 2020-01-31 MED ORDER — LACTATED RINGERS IV SOLN: 500.0000 mL | Freq: Once | INTRAVENOUS | Status: DC

## 2020-01-31 MED ORDER — MAGNESIUM SULFATE 40 GM/1000ML IV SOLN
1.0000 g/h | INTRAVENOUS | Status: AC
  Administered 2020-01-31: 1 g/h via INTRAVENOUS
  Filled 2020-01-31: qty 1000

## 2020-01-31 MED ORDER — SODIUM CHLORIDE (PF) 0.9 % IJ SOLN
INTRAMUSCULAR | Status: DC | PRN
  Administered 2020-01-31: 10.5 mL/h via EPIDURAL

## 2020-01-31 MED ORDER — SIMETHICONE 80 MG PO CHEW: 80.0000 mg | CHEWABLE_TABLET | ORAL | Status: DC | PRN

## 2020-01-31 MED ORDER — ONDANSETRON HCL 4 MG PO TABS: 4.0000 mg | ORAL_TABLET | ORAL | Status: DC | PRN

## 2020-01-31 MED ORDER — LACTATED RINGERS IV SOLN: INTRAVENOUS | Status: DC

## 2020-01-31 MED ORDER — MISOPROSTOL 200 MCG PO TABS
ORAL_TABLET | ORAL | Status: AC
  Filled 2020-01-31: qty 5

## 2020-01-31 MED ORDER — MEASLES, MUMPS & RUBELLA VAC IJ SOLR: 0.5000 mL | Freq: Once | INTRAMUSCULAR | Status: DC

## 2020-01-31 MED ORDER — TRANEXAMIC ACID-NACL 1000-0.7 MG/100ML-% IV SOLN: 1000.0000 mg | Freq: Once | INTRAVENOUS | Status: DC

## 2020-01-31 MED ORDER — TETANUS-DIPHTH-ACELL PERTUSSIS 5-2.5-18.5 LF-MCG/0.5 IM SUSP: 0.5000 mL | Freq: Once | INTRAMUSCULAR | Status: DC

## 2020-01-31 MED ORDER — DIPHENHYDRAMINE HCL 25 MG PO CAPS: 25.0000 mg | ORAL_CAPSULE | Freq: Four times a day (QID) | ORAL | Status: DC | PRN

## 2020-01-31 MED ORDER — IBUPROFEN 600 MG PO TABS: 600.0000 mg | ORAL_TABLET | Freq: Three times a day (TID) | ORAL | Status: DC | PRN

## 2020-01-31 MED ORDER — LIDOCAINE HCL (PF) 1 % IJ SOLN
INTRAMUSCULAR | Status: DC | PRN
  Administered 2020-01-31: 3 mL via EPIDURAL
  Administered 2020-01-31: 4 mL via EPIDURAL

## 2020-01-31 MED ORDER — ONDANSETRON HCL 4 MG/2ML IJ SOLN: 4.0000 mg | INTRAMUSCULAR | Status: DC | PRN

## 2020-01-31 MED ORDER — COCONUT OIL OIL: 1.0000 "application " | TOPICAL_OIL | Status: DC | PRN

## 2020-01-31 MED ORDER — MISOPROSTOL 200 MCG PO TABS
1000.0000 ug | ORAL_TABLET | Freq: Once | ORAL | Status: AC
  Administered 2020-01-31: 1000 ug via RECTAL

## 2020-01-31 MED ORDER — PHENYLEPHRINE 40 MCG/ML (10ML) SYRINGE FOR IV PUSH (FOR BLOOD PRESSURE SUPPORT): 80.0000 ug | PREFILLED_SYRINGE | INTRAVENOUS | Status: DC | PRN

## 2020-01-31 MED ORDER — TRANEXAMIC ACID-NACL 1000-0.7 MG/100ML-% IV SOLN
INTRAVENOUS | Status: AC
  Administered 2020-01-31: 1000 mg
  Filled 2020-01-31: qty 100

## 2020-01-31 MED ORDER — ACETAMINOPHEN 325 MG PO TABS
650.0000 mg | ORAL_TABLET | Freq: Four times a day (QID) | ORAL | Status: DC | PRN
  Administered 2020-01-31: 650 mg via ORAL
  Filled 2020-01-31: qty 2

## 2020-01-31 MED ORDER — DIBUCAINE (PERIANAL) 1 % EX OINT: 1.0000 "application " | TOPICAL_OINTMENT | CUTANEOUS | Status: DC | PRN

## 2020-01-31 MED ORDER — SENNOSIDES-DOCUSATE SODIUM 8.6-50 MG PO TABS
2.0000 | ORAL_TABLET | ORAL | Status: DC
  Administered 2020-02-01: 01:00:00 2 via ORAL
  Filled 2020-01-31: qty 2

## 2020-01-31 MED ORDER — FENTANYL-BUPIVACAINE-NACL 0.5-0.125-0.9 MG/250ML-% EP SOLN
12.0000 mL/h | EPIDURAL | Status: DC | PRN
  Filled 2020-01-31: qty 250

## 2020-01-31 MED ORDER — WITCH HAZEL-GLYCERIN EX PADS: 1.0000 "application " | MEDICATED_PAD | CUTANEOUS | Status: DC | PRN

## 2020-01-31 NOTE — Discharge Summary (Signed)
Postpartum Discharge Summary     Patient Name: Christina Velazquez DOB: January 16, 1996 MRN: [id number]  Date of admission: 01/28/2020 Delivering Provider: Gladys Damme   Date of discharge: 02/02/2020  Admitting diagnosis: Preeclampsia [O14.90] Intrauterine pregnancy: [redacted]w[redacted]d    Secondary diagnosis:  Principal Problem:   Severe pre-eclampsia Active Problems:   Supervision of low-risk pregnancy   Anemia in pregnancy   [redacted] weeks gestation of pregnancy  Additional problems: None     Discharge diagnosis: Preterm Pregnancy Delivered and Preeclampsia (severe)                                                                                                Post partum procedures:n/a  Augmentation: AROM, Pitocin, Cytotec and Foley Balloon  Complications: None, [id number]>2297LGand ROM>24 hours  Hospital course:  Induction of Labor With Vaginal Delivery   24y.o. yo G1P0 at 341w4das admitted to the hospital 01/28/2020 for induction of labor.  Indication for induction: Severe Pre-E.  Patient had an labor course as follows: Initial SVE: closed/50/-2. Patient received Cytotec, FB, Pitocin and AROM. Rupture > 24 hours. Received Magnesium. Received epidural. She eventually progressed to complete.  Membrane Rupture Time/Date: 9:39 AM ,01/30/2020   Intrapartum Procedures: Episiotomy: None [1]                                         Lacerations:  None [1]  Patient had delivery of a Viable infant.  Information for the patient's newborn:  GrSierah, Lacewell0[[id number]]Delivery Method: Vag-Spont    01/31/2020  Details of delivery can be found in separate delivery note. Patient continued on Magnesium for 24 hours. BP's monitored and she was started on procardia 30 mg XL. She had an uncomplicated postpartum course and by PPD#2, was eating, drink, ambulating without issue. No pain. Minimal bleeding and overall, feeling very well.  Patient is discharged home 02/02/20.  Reviewed options for contraception, she  is leaning towards nexplanon. Reviewed risks benefits of LARCs, she would like nexplanon at post partum visit.  Delivery time: 9:49 AM    Magnesium Sulfate received: Yes BMZ received: Yes Rhophylac:No MMR:No Transfusion:No  Physical exam  Vitals:   02/01/20 1936 02/01/20 2332 02/02/20 0533 02/02/20 0806  BP: (!) 148/95 (!) 142/80 137/88 (!) 153/91  Pulse: (!) 109 (!) 101 87 88  Resp: _0 Temp: 98.3 F (36.8 C) 97.9 F (36.6 C) 98.3 F (36.8 C) 98 F (36.7 C)  TempSrc: Oral Oral Oral Oral  SpO2: 99% 99% 99% 100%  Weight:      Height:       General: alert, cooperative and no distress Lochia: appropriate Uterine Fundus: firm Incision: N/A DVT Evaluation: No evidence of DVT seen on physical exam. Labs: Lab Results  Component Value Date   WBC 15.8 (H) 01/31/2020   HGB 11.0 (L) 01/31/2020   HCT 33.9 (L) 01/31/2020   MCV 83.9 01/31/2020   PLT 333 01/31/2020   CMP Latest Ref Rng &  Units 01/30/2020  Glucose 70 - 99 mg/dL 116(H)  BUN 6 - 20 mg/dL <5(L)  Creatinine 0.44 - 1.00 mg/dL 0.81  Sodium 135 - 145 mmol/L 137  Potassium 3.5 - 5.1 mmol/L 4.3  Chloride 98 - 111 mmol/L 107  CO2 22 - 32 mmol/L 20(L)  Calcium 8.9 - 10.3 mg/dL 7.7(L)  Total Protein 6.5 - 8.1 g/dL 6.3(L)  Total Bilirubin 0.3 - 1.2 mg/dL 0.4  Alkaline Phos 38 - 126 U/L 114  AST 15 - 41 U/L 21  ALT 0 - 44 U/L 17    Discharge instruction: per After Visit Summary and "Baby and Me Booklet".  After visit meds:  Allergies as of 02/02/2020      Reactions   Feraheme [ferumoxytol] Shortness Of Breath, Nausea Only, Other (See Comments)   Chest pain, diaphoretic      Medication List    TAKE these medications   acetaminophen 325 MG tablet Commonly known as: TYLENOL Take 650 mg by mouth every 6 (six) hours as needed for mild pain or headache.   Blood Pressure Kit Devi 1 Device by Does not apply route daily. ICD 10: Z34.00   NIFEdipine 30 MG 24 hr tablet Commonly known as: ADALAT CC Take 1  tablet (30 mg total) by mouth daily. Start taking on: February 03, 2020   PrePLUS 27-1 MG Tabs Take 1 tablet by mouth daily.       Diet: routine diet  Activity: Advance as tolerated. Pelvic rest for 6 weeks.   Outpatient follow up:4 weeks Follow up Appt: Future Appointments  Date Time Provider Department Center  02/02/2020  3:45 PM [facility]-M[facility] US 5 WH-MFCUS MFC-US  02/10/2020  9:00 AM [facility] NURSE [facility]OC  03/07/2020 10:55 AM Lawrence, Erin, NP [facility]OC   Follow up Visit: Follow-up Information    [facility]. Schedule an appointment as soon as possible for a visit in 1 week(s).   Specialty: Obstetrics and Gynecology Why: BP check Contact information: [address], [id number] [place] [address] [phone]          Please schedule this patient for Postpartum visit in: 4 weeks with the following provider: Any provider Virtual High risk pregnancy complicated by: HTN Delivery mode:  SVD Anticipated Birth Control:  other/unsure PP Procedures needed: BP check  Schedule Integrated BH visit: no  Newborn Data: Live born female  Birth Weight: 4 lb 5.8 oz (1980 g) APGAR (1 MIN): 8 APGAR (5 MINS): 9    Newborn Delivery   Birth date/time: 01/31/2020 09:49:00 Delivery type: Vaginal, Spontaneous      Baby Feeding: Breast Disposition:home with mother   02/02/2020 Kelly M Davis, MD   

## 2020-01-31 NOTE — Anesthesia Postprocedure Evaluation (Signed)
Anesthesia Post Note  Patient: Christina Velazquez  Procedure(s) Performed: AN AD HOC LABOR EPIDURAL     Patient location during evaluation: Mother Baby Anesthesia Type: Epidural Level of consciousness: awake Pain management: satisfactory to patient Vital Signs Assessment: post-procedure vital signs reviewed and stable Respiratory status: spontaneous breathing Cardiovascular status: stable Anesthetic complications: no    Last Vitals:  Vitals:   01/31/20 1500 01/31/20 1602  BP:  (!) 135/91  Pulse:  93  Resp: 17 18  Temp:  37.6 C  SpO2:  100%    Last Pain:  Vitals:   01/31/20 1602  TempSrc: Oral  PainSc:    Pain Goal: Patients Stated Pain Goal: 3 (01/31/20 1600)                 Cephus Shelling

## 2020-01-31 NOTE — Progress Notes (Signed)
LABOR PROGRESS NOTE  Christina Velazquez is a 24 y.o. G1P0 at [redacted]w[redacted]d  admitted for IOL for PreE w SF (BP, HA).  Subjective: Sleeping  Objective: BP 112/66   Pulse 81   Temp 98.3 F (36.8 C) (Oral)   Resp 18   Ht 4\' 11"  (1.499 m)   Wt 66.7 kg   LMP 03/25/2019 (Exact Date)   SpO2 100%   BMI 29.69 kg/m  or  Vitals:   01/31/20 0315 01/31/20 0320 01/31/20 0325 01/31/20 0330  BP:    112/66  Pulse:    81  Resp:    18  Temp:      TempSrc:      SpO2: 99% 99% 100% 100%  Weight:      Height:         Dilation: 6 Effacement (%): 90 Cervical Position: Posterior Station: -1 Presentation: Vertex Exam by:: K.Cowher RN FHT: baseline rate 110, moderate varibility, -acel, -decel Toco: q2-3 min per RN, poor tracing  Labs: Lab Results  Component Value Date   WBC 15.8 (H) 01/31/2020   HGB 11.0 (L) 01/31/2020   HCT 33.9 (L) 01/31/2020   MCV 83.9 01/31/2020   PLT 333 01/31/2020    Patient Active Problem List   Diagnosis Date Noted  . Preeclampsia 01/29/2020  . Gestational hypertension 01/27/2020  . Anemia in pregnancy 12/08/2019  . Supervision of low-risk pregnancy 07/14/2019    Assessment / Plan: 24 y.o. G1P0 at [redacted]w[redacted]d here for IOL for PreE w SF (BP, HA).  Labor: s/p miso x6 and FB, started on pitocin @2046 . Stopped around 0250 for loss of variability and possible lates. Since that time has continued to progress spontaneously and is now 9 cm with bulging bag. Expectant management at this time, recheck in 2 hours and consider AROM if no progress.  Fetal Wellbeing:  Cat I Pain Control:  epidural GBS: unknown on penicillin Anticipated MOD:  SVD  PreE w SF: severe range BP's on admission now normal, on Mg  PTD: s/p BMZ x2  [redacted]w[redacted]d, MD/[facility] OB Fellow  01/31/2020, 3:56 AM

## 2020-01-31 NOTE — Anesthesia Procedure Notes (Signed)

## 2020-01-31 NOTE — Anesthesia Preprocedure Evaluation (Signed)
Anesthesia Evaluation  Patient identified by MRN, date of birth, ID band Patient awake    Reviewed: Allergy & Precautions, Patient's Chart, lab work & pertinent test results  Airway Mallampati: II  TM Distance: >3 FB Neck ROM: Full    Dental no notable dental hx. (+) Teeth Intact   Pulmonary neg pulmonary ROS,    Pulmonary exam normal breath sounds clear to auscultation       Cardiovascular hypertension, Normal cardiovascular exam Rhythm:Regular Rate:Normal     Neuro/Psych negative neurological ROS  negative psych ROS   GI/Hepatic Neg liver ROS, GERD  ,  Endo/Other  negative endocrine ROS  Renal/GU negative Renal ROS  negative genitourinary   Musculoskeletal negative musculoskeletal ROS (+)   Abdominal   Peds  Hematology  (+) anemia ,   Anesthesia Other Findings   Reproductive/Obstetrics (+) Pregnancy Pre eclampsia                             Anesthesia Physical Anesthesia Plan  ASA: III  Anesthesia Plan: Epidural   Post-op Pain Management:    Induction:   PONV Risk Score and Plan:   Airway Management Planned: Natural Airway  Additional Equipment:   Intra-op Plan:   Post-operative Plan:   Informed Consent: I have reviewed the patients History and Physical, chart, labs and discussed the procedure including the risks, benefits and alternatives for the proposed anesthesia with the patient or authorized representative who has indicated his/her understanding and acceptance.       Plan Discussed with: Anesthesiologist  Anesthesia Plan Comments:         Anesthesia Quick Evaluation

## 2020-01-31 NOTE — Lactation Note (Signed)
This note was copied from a baby's chart. Lactation Consultation Note  Patient Name: [name] [id number] Date: 01/31/2020 Reason for consult: Initial assessment;Late-preterm 34-36.6wks;Infant < 6lbs;Primapara;1st time breastfeeding  P1 mother whose infant is now 7 hours old.  This is a LPTI at 35+4 weeks weighing <5 lbs.  Mother is receiving magnesium sulfate for pre eclampsia.  RN and NP in room when I arrived.  Baby has been having decreased temperatures and is being transferred to the nursery to be placed under the radiant warmer.  NP spoke with the parents and hope to return her when she has a good body temperature.  Offered to initiate the DEBP for mother and she was agreeable to beginning to pump.  Pump parts, assembly, disassembly and cleaning reviewed.  Observed mother pumping and the #24 flange size is appropriate at this time.  Discussed the LPTI policy with parents and reviewed supplementation guidelines.  Baby has been fed Similac 22 calorie and consumed 10 mls at 1425 with the slow flow nipple.  Discussed expectations for breast feeding and informed mother that LC would be available to assist as needed.  Encouraged a lot of STS with baby and to keep her hat on at all times.  Mother was beginning to obtain a few drops of colostrum when I left the room.  Colostrum container provided and milk storage times reviewed.  Finger feeding demonstrated.  Showed mother how to hand express and suggested she practice before/after pumping to help increase milk supply.  Mom made aware of O/P services, breastfeeding support groups, community resources, and our phone # for post-discharge questions. Father present.  Mother does not have a DEBP for home use.  She is currently living in [place] but has to be out of her current residence by Feb. 15.  She will then be moving to [facility].  Mother stated she has been accepted to receive [facility] and has her card.  She was told to contact the office after  baby was born.  I suggested she follow up tomorrow.  WIC referral faxed to both counties by me to ensure that the situation is assessed well and she is able to obtain a DEBP at discharge with her upcoming move.  Mother appreciative.  RN updated.   Maternal Data Formula Feeding for Exclusion: No Has patient been taught Hand Expression?: Yes Does the patient have breastfeeding experience prior to this delivery?: No  Feeding Feeding Type: Bottle Fed - Formula Nipple Type: Slow - flow  LATCH Score                   Interventions    Lactation Tools Discussed/Used Tools: Pump WIC Program: Yes Pump Review: Setup, frequency, and cleaning;Milk Storage Initiated by:: Damarri Rampy Date initiated:: 01/31/20   Consult Status Consult Status: Follow-up Date: 02/01/20 Follow-up type: In-patient    Dora Sims 01/31/2020, 3:57 PM

## 2020-01-31 NOTE — Progress Notes (Signed)
Labor Progress Note Christina Velazquez is a 24 y.o. G1P0 at [redacted]w[redacted]d presented for IOL d/t PEC w/ SF (BP, HA) S: patient more uncomfortable with pressure  O:  BP (!) 150/96   Pulse 88   Temp 98.6 F (37 C) (Oral)   Resp 20   Ht 4\' 11"  (1.499 m)   Wt 66.7 kg   LMP 03/25/2019 (Exact Date)   SpO2 100%   BMI 29.69 kg/m  EFM: 115 bpm/-accels, min-mod variability/-decels  CVE: Dilation: 10 Dilation Complete Date: 01/31/20 Dilation Complete Time: 0900 Effacement (%): 100 Cervical Position: Posterior Station: Plus 1 Presentation: Vertex Exam by:: Dr [email or web address]    A&P: 24 y.o. G1P0 [redacted]w[redacted]d here for IOL for PEC w SF (BP,HA) #Labor: Progressing well. Complete and +1. Patient had bulging bag AROM w clear fluid just now. Plan to titrate pitocin to have ctx q-2-62mins instead of 5-6 and then push. #Pain: epidural #FWB: Cat I  #GBS unknown on PCN #PEC: Patient remains asymptomatic at this time. BP's have been mostly normotensive, just now mildly elevated to 150/96. On Mg w/o signs of toxicity. Continue to monitor. 1m, MD 9:18 AM

## 2020-02-01 LAB — CULTURE, BETA STREP (GROUP B ONLY)

## 2020-02-01 LAB — SURGICAL PATHOLOGY

## 2020-02-01 MED ORDER — NIFEDIPINE ER OSMOTIC RELEASE 30 MG PO TB24
30.0000 mg | ORAL_TABLET | Freq: Every day | ORAL | Status: DC
  Administered 2020-02-01 – 2020-02-02 (×2): 30 mg via ORAL
  Filled 2020-02-01 (×2): qty 1

## 2020-02-01 NOTE — Progress Notes (Signed)
Post Partum Day 1 s/p SVD after IOL for severe preeclampsia at [redacted]w[redacted]d  Subjective: No complaints, up ad lib, voiding and tolerating PO Patient denies any headaches, visual symptoms, RUQ/epigastric pain or other concerning symptoms.  Objective: Blood pressure 137/87, pulse 83, temperature 98.5 F (36.9 C), temperature source Oral, resp. rate 18, height 4\' 11"  (1.499 m), weight 66.7 kg, last menstrual period 03/25/2019, SpO2 100 %, unknown if currently breastfeeding.  Patient Vitals for the past 24 hrs:  BP Temp Temp src Pulse Resp SpO2  02/01/20 0545 137/87 98.5 F (36.9 C) Oral 83 18 100 %  02/01/20 0430 -- -- -- -- 19 --  02/01/20 0330 -- -- -- -- 18 --  02/01/20 0235 (!) 149/94 (!) 97.4 F (36.3 C) Oral 90 17 99 %  02/01/20 0100 -- -- -- -- 17 --  02/01/20 0030 -- -- -- -- 17 --  01/31/20 2330 -- -- -- -- 19 --  01/31/20 2246 -- -- -- -- -- 97 %  01/31/20 2245 (!) 137/91 99.4 F (37.4 C) Oral (!) 103 20 95 %  01/31/20 2110 -- -- -- -- 17 --  01/31/20 1925 (!) 153/102 -- -- 99 -- --  01/31/20 1910 (!) 142/107 98.7 F (37.1 C) Oral 91 18 100 %  01/31/20 1800 -- -- -- -- 18 --  01/31/20 1700 -- -- -- -- 18 --  01/31/20 1602 (!) 135/91 99.7 F (37.6 C) Oral 93 18 100 %  01/31/20 1500 -- -- -- -- 17 --  01/31/20 1400 -- -- -- -- 17 --  01/31/20 1250 (!) 136/94 100.3 F (37.9 C) Oral 92 16 98 %  01/31/20 1147 (!) 145/98 98.9 F (37.2 C) Oral (!) 102 16 100 %  01/31/20 1117 (!) 132/94 -- -- (!) 102 -- --  01/31/20 1116 (!) 132/94 -- -- (!) 102 -- --  01/31/20 1100 (!) 149/88 -- -- (!) 103 18 --  01/31/20 1045 132/64 -- -- (!) 109 20 --  01/31/20 1030 (!) 142/102 -- -- (!) 117 -- --  01/31/20 1018 (!) 154/86 98.4 F (36.9 C) Oral (!) 114 20 --  01/31/20 0943 (!) 161/100 -- -- 97 20 --  01/31/20 0900 (!) 150/96 -- -- 88 20 100 %  01/31/20 0830 117/72 -- -- 87 17 97 %  01/31/20 0803 124/70 -- -- 90 20 99 %    Physical Exam:  General: alert and no distress Lochia:  appropriate Uterine Fundus: firm DVT Evaluation: No evidence of DVT seen on physical exam. Negative Homan's sign. No cords or calf tenderness. No significant calf/ankle edema.  Recent Labs    01/30/20 0346 01/31/20 0104  HGB 11.0* 11.0*  HCT 34.1* 33.9*    Assessment/Plan: Magnesium sulfate to be off this morning, will start Procardia for BP control and titrate up as needed. Breastfeeding  Contraception: discussed LARC and other methods, still undecided Plan for discharge tomorrow if remains stable   LOS: 3 days   03/30/20, MD 02/01/2020, 7:38 AM

## 2020-02-01 NOTE — Lactation Note (Signed)
This note was copied from a baby's chart. Lactation Consultation Note  Patient Name: Christina Velazquez [id number] Date: 02/01/2020 Reason for consult: Follow-up assessment;Primapara;1st time breastfeeding;Late-preterm 34-36.6wks;Infant < 6lbs  LC in to visit with P1 Mom of 23 hr old LPTI weighing <5 lbs and down 4.8% from birth weight.   Baby getting her bath by NT currently.  Baby placed STS on Mom after pumping and after bath.  Mom hasn't pumped since assisted by [facility] yesterday afternoon.  Offered to assist with her starting to pump this am.  Mom agreeable.  24 mm flanges are a good fit and assisted Mom to pump in initiation setting.    Baby has been fed 22 cal formula by slow flow nipple, last feeding she took 20 ml.    3 ml colostrum expressed with pumping.  Mom will feed this to baby before the formula.  Reviewed paced bottle feeding with parents.   Plan- 1- Keep baby STS as much as possible 2- Feed baby often with cues, awakening her if asleep at 3 hrs. 3- Feed baby any EBM first, adding formula to equal 10-20 ml per feeding today. 4- Pump both breasts on initiation setting, adding breast massage and hand expression to collect as much colostrum as possible. 5- ask for help prn.  Interventions Interventions: Breast feeding basics reviewed;Skin to skin;Breast massage;Hand express;DEBP  Lactation Tools Discussed/Used Tools: Pump;Bottle Breast pump type: Double-Electric Breast Pump   Consult Status Consult Status: Follow-up Date: 02/02/20 Follow-up type: In-patient    Judee Clara 02/01/2020, 9:54 AM

## 2020-02-02 ENCOUNTER — Encounter: Payer: Medicaid Other | Admitting: Nurse Practitioner

## 2020-02-02 ENCOUNTER — Other Ambulatory Visit: Payer: Medicaid Other

## 2020-02-02 ENCOUNTER — Ambulatory Visit (HOSPITAL_COMMUNITY)
Admission: RE | Admit: 2020-02-02 | Payer: Medicaid Other | Source: Ambulatory Visit | Attending: Certified Nurse Midwife | Admitting: Certified Nurse Midwife

## 2020-02-02 MED ORDER — NIFEDIPINE ER 30 MG PO TB24: 30.0000 mg | ORAL_TABLET | Freq: Every day | ORAL | 2 refills | Status: AC

## 2020-02-02 MED FILL — NIFEdipine ER 30 MG TB24: 30 | 30 days supply | Qty: 30 | Fill #0

## 2020-02-02 NOTE — Discharge Instructions (Signed)
Use the following websites (and others) to help learn more about your contraception options and find the method that is right for you!  - The Centers for Disease Control (CDC) website: MomentumMarket.be  - [facility] website: https://www.[facility].org/learn/birth-control  - [employer]org: https://www.bedsider.org/methods     Intrauterine Device Information An intrauterine device (IUD) is a medical device that is inserted in the uterus to prevent pregnancy. It is a small, T-shaped device that has one or two nylon strings hanging down from it. The strings hang out of the lower part of the uterus (cervix) to allow for future IUD removal. There are two types of IUDs available:  Hormone IUD. This type of IUD is made of plastic and contains the hormone progestin (synthetic progesterone). A hormone IUD may last 3-5 years.  Copper IUD. This type of IUD has copper wire wrapped around it. A copper IUD may last up to 10 years. How is an IUD inserted? An IUD is inserted through the vagina and placed into the uterus with a minor medical procedure. The exact procedure for IUD insertion may vary among health care providers and hospitals. How does an IUD work? Synthetic progesterone in a hormonal IUD prevents pregnancy by:  Thickening cervical mucus to prevent sperm from entering the uterus.  Thinning the uterine lining to prevent a fertilized egg from being implanted there. Copper in a copper IUD prevents pregnancy by making the uterus and fallopian tubes produce a fluid that kills sperm. What are the advantages of an IUD? Advantages of either type of IUD  It is highly effective in preventing pregnancy.  It is reversible. You can become pregnant shortly after the IUD is removed.  It is low-maintenance and can stay in place for a long time.  There are no estrogen-related side effects.  It can be used when breastfeeding.  It is not  associated with weight gain.  It can be inserted right after childbirth, an abortion, or a miscarriage. Advantages of a hormone IUD  If it is inserted within 7 days of your period starting, it works right after it is inserted. If the hormone IUD is inserted at any other time in your cycle, you will need to use a backup method of birth control for 7 days after insertion.  It can make menstrual periods lighter.  It can reduce menstrual cramping.  It can be used for 3-5 years. Advantages of a copper IUD  It works right after it is inserted.  It can be used as a form of emergency birth control if it is inserted within 5 days after having unprotected sex.  It does not interfere with your body's natural hormones.  It can be used for 10 years. What are the disadvantages of an IUD?  An IUD may cause irregular menstrual bleeding for a period of time after insertion.  You may have pain during insertion and have cramping and vaginal bleeding after insertion.  An IUD may cut the uterus (uterine perforation) when it is inserted. This is rare.  An IUD may cause pelvic inflammatory disease (PID), which is an infection in the uterus and fallopian tubes. This is rare, and it usually happens during the first 20 days after the IUD is inserted.  A copper IUD can make your menstrual flow heavier and more painful. How is an IUD removed?  You will lie on your back with your knees bent and your feet in footrests (stirrups).  A device will be inserted into your vagina to spread apart the  vaginal walls (speculum). This will allow your health care provider to see the strings attached to the IUD.  Your health care provider will use a small instrument (forceps) to grasp the IUD strings and pull firmly until the IUD is removed. You may have some discomfort when the IUD is removed. Your health care provider may recommend taking over-the-counter pain relievers, such as ibuprofen, before the procedure. You may  also have minor spotting for a few days after the procedure. The exact procedure for IUD removal may vary among health care providers and hospitals. Is the IUD right for me? Your health care provider will make sure you are a good candidate for an IUD and will discuss the advantages, disadvantages, and possible side effects with you. Summary  An intrauterine device (IUD) is a medical device that is inserted in the uterus to prevent pregnancy. It is a small, T-shaped device that has one or two nylon strings hanging down from it.  A hormone IUD contains the hormone progestin (synthetic progesterone). A copper IUD has copper wire wrapped around it.  Synthetic progesterone in a hormone IUD prevents pregnancy by thickening cervical mucus and thinning the walls of the uterus. Copper in a copper IUD prevents pregnancy by making the uterus and fallopian tubes produce a fluid that kills sperm.  A hormone IUD can be left in place for 3-5 years. A copper IUD can be left in place for up to 10 years.  An IUD is inserted and removed by a health care provider. You may feel some pain during insertion and removal. Your health care provider may recommend taking over-the-counter pain medicine, such as ibuprofen, before an IUD procedure. This information is not intended to replace advice given to you by your health care provider. Make sure you discuss any questions you have with your health care provider. Document Revised: 11/21/2017 Document Reviewed: 01/07/2017 Elsevier Patient Education  2020 [employer]. Etonogestrel implant What is this medicine? ETONOGESTREL (et oh noe JES trel) is a contraceptive (birth control) device. It is used to prevent pregnancy. It can be used for up to 3 years. This medicine may be used for other purposes; ask your health care provider or pharmacist if you have questions. COMMON BRAND NAME(S): Implanon, Nexplanon What should I tell my health care provider before I take this  medicine? They need to know if you have any of these conditions:  abnormal vaginal bleeding  blood vessel disease or blood clots  breast, cervical, endometrial, ovarian, liver, or uterine cancer  diabetes  gallbladder disease  heart disease or recent heart attack  high blood pressure  high cholesterol or triglycerides  kidney disease  liver disease  migraine headaches  seizures  stroke  tobacco smoker  an unusual or allergic reaction to etonogestrel, anesthetics or antiseptics, other medicines, foods, dyes, or preservatives  pregnant or trying to get pregnant  breast-feeding How should I use this medicine? This device is inserted just under the skin on the inner side of your upper arm by a health care professional. Talk to your pediatrician regarding the use of this medicine in children. Special care may be needed. Overdosage: If you think you have taken too much of this medicine contact a poison control center or emergency room at once. NOTE: This medicine is only for you. Do not share this medicine with others. What if I miss a dose? This does not apply. What may interact with this medicine? Do not take this medicine with any of the following  medications:  amprenavir  fosamprenavir This medicine may also interact with the following medications:  acitretin  aprepitant  armodafinil  bexarotene  bosentan  carbamazepine  certain medicines for fungal infections like fluconazole, ketoconazole, itraconazole and voriconazole  certain medicines to treat hepatitis, HIV or AIDS  cyclosporine  felbamate  griseofulvin  lamotrigine  modafinil  oxcarbazepine  phenobarbital  phenytoin  primidone  rifabutin  rifampin  rifapentine  St. John's wort  topiramate This list may not describe all possible interactions. Give your health care provider a list of all the medicines, herbs, non-prescription drugs, or dietary supplements you use. Also  tell them if you smoke, drink alcohol, or use illegal drugs. Some items may interact with your medicine. What should I watch for while using this medicine? This product does not protect you against HIV infection (AIDS) or other sexually transmitted diseases. You should be able to feel the implant by pressing your fingertips over the skin where it was inserted. Contact your doctor if you cannot feel the implant, and use a non-hormonal birth control method (such as condoms) until your doctor confirms that the implant is in place. Contact your doctor if you think that the implant may have broken or become bent while in your arm. You will receive a user card from your health care provider after the implant is inserted. The card is a record of the location of the implant in your upper arm and when it should be removed. Keep this card with your health records. What side effects may I notice from receiving this medicine? Side effects that you should report to your doctor or health care professional as soon as possible:  allergic reactions like skin rash, itching or hives, swelling of the face, lips, or tongue  breast lumps, breast tissue changes, or discharge  breathing problems  changes in emotions or moods  coughing up blood  if you feel that the implant may have broken or bent while in your arm  high blood pressure  pain, irritation, swelling, or bruising at the insertion site  scar at site of insertion  signs of infection at the insertion site such as fever, and skin redness, pain or discharge  signs and symptoms of a blood clot such as breathing problems; changes in vision; chest pain; severe, sudden headache; pain, swelling, warmth in the leg; trouble speaking; sudden numbness or weakness of the face, arm or leg  signs and symptoms of liver injury like dark yellow or brown urine; general ill feeling or flu-like symptoms; light-colored stools; loss of appetite; nausea; right upper belly  pain; unusually weak or tired; yellowing of the eyes or skin  unusual vaginal bleeding, discharge Side effects that usually do not require medical attention (report to your doctor or health care professional if they continue or are bothersome):  acne  breast pain or tenderness  headache  irregular menstrual bleeding  nausea This list may not describe all possible side effects. Call your doctor for medical advice about side effects. You may report side effects to FDA at 1-800-FDA-1088. Where should I keep my medicine? This drug is given in a hospital or clinic and will not be stored at home. NOTE: This sheet is a summary. It may not cover all possible information. If you have questions about this medicine, talk to your doctor, pharmacist, or health care provider.  2020 Elsevier/Gold Standard (2019-09-21 11:33:04)    Postpartum Care After Vaginal Delivery This sheet gives you information about how to care for yourself  from the time you deliver your baby to up to 6-12 weeks after delivery (postpartum period). Your health care provider may also give you more specific instructions. If you have problems or questions, contact your health care provider. Follow these instructions at home: Vaginal bleeding  It is normal to have vaginal bleeding (lochia) after delivery. Wear a sanitary pad for vaginal bleeding and discharge. ? During the first week after delivery, the amount and appearance of lochia is often similar to a menstrual period. ? Over the next few weeks, it will gradually decrease to a dry, yellow-brown discharge. ? For most women, lochia stops completely by 4-6 weeks after delivery. Vaginal bleeding can vary from woman to woman.  Change your sanitary pads frequently. Watch for any changes in your flow, such as: ? A sudden increase in volume. ? A change in color. ? Large blood clots.  If you pass a blood clot from your vagina, save it and call your health care provider to  discuss. Do not flush blood clots down the toilet before talking with your health care provider.  Do not use tampons or douches until your health care provider says this is safe.  If you are not breastfeeding, your period should return 6-8 weeks after delivery. If you are feeding your child breast milk only (exclusive breastfeeding), your period may not return until you stop breastfeeding. Perineal care  Keep the area between the vagina and the anus (perineum) clean and dry as told by your health care provider. Use medicated pads and pain-relieving sprays and creams as directed.  If you had a cut in the perineum (episiotomy) or a tear in the vagina, check the area for signs of infection until you are healed. Check for: ? More redness, swelling, or pain. ? Fluid or blood coming from the cut or tear. ? Warmth. ? Pus or a bad smell.  You may be given a squirt bottle to use instead of wiping to clean the perineum area after you go to the bathroom. As you start healing, you may use the squirt bottle before wiping yourself. Make sure to wipe gently.  To relieve pain caused by an episiotomy, a tear in the vagina, or swollen veins in the anus (hemorrhoids), try taking a warm sitz bath 2-3 times a day. A sitz bath is a warm water bath that is taken while you are sitting down. The water should only come up to your hips and should cover your buttocks. Breast care  Within the first few days after delivery, your breasts may feel heavy, full, and uncomfortable (breast engorgement). Milk may also leak from your breasts. Your health care provider can suggest ways to help relieve the discomfort. Breast engorgement should go away within a few days.  If you are breastfeeding: ? Wear a bra that supports your breasts and fits you well. ? Keep your nipples clean and dry. Apply creams and ointments as told by your health care provider. ? You may need to use breast pads to absorb milk that leaks from your  breasts. ? You may have uterine contractions every time you breastfeed for up to several weeks after delivery. Uterine contractions help your uterus return to its normal size. ? If you have any problems with breastfeeding, work with your health care provider or Advertising copywriter.  If you are not breastfeeding: ? Avoid touching your breasts a lot. Doing this can make your breasts produce more milk. ? Wear a good-fitting bra and use cold packs to  help with swelling. ? Do not squeeze out (express) milk. This causes you to make more milk. Intimacy and sexuality  Ask your health care provider when you can engage in sexual activity. This may depend on: ? Your risk of infection. ? How fast you are healing. ? Your comfort and desire to engage in sexual activity.  You are able to get pregnant after delivery, even if you have not had your period. If desired, talk with your health care provider about methods of birth control (contraception). Medicines  Take over-the-counter and prescription medicines only as told by your health care provider.  If you were prescribed an antibiotic medicine, take it as told by your health care provider. Do not stop taking the antibiotic even if you start to feel better. Activity  Gradually return to your normal activities as told by your health care provider. Ask your health care provider what activities are safe for you.  Rest as much as possible. Try to rest or take a nap while your baby is sleeping. Eating and drinking   Drink enough fluid to keep your urine pale yellow.  Eat high-fiber foods every day. These may help prevent or relieve constipation. High-fiber foods include: ? Whole grain cereals and breads. ? Brown rice. ? Beans. ? Fresh fruits and vegetables.  Do not try to lose weight quickly by cutting back on calories.  Take your prenatal vitamins until your postpartum checkup or until your health care provider tells you it is okay to  stop. Lifestyle  Do not use any products that contain nicotine or tobacco, such as cigarettes and e-cigarettes. If you need help quitting, ask your health care provider.  Do not drink alcohol, especially if you are breastfeeding. General instructions  Keep all follow-up visits for you and your baby as told by your health care provider. Most women visit their health care provider for a postpartum checkup within the first 3-6 weeks after delivery. Contact a health care provider if:  You feel unable to cope with the changes that your child brings to your life, and these feelings do not go away.  You feel unusually sad or worried.  Your breasts become red, painful, or hard.  You have a fever.  You have trouble holding urine or keeping urine from leaking.  You have little or no interest in activities you used to enjoy.  You have not breastfed at all and you have not had a menstrual period for 12 weeks after delivery.  You have stopped breastfeeding and you have not had a menstrual period for 12 weeks after you stopped breastfeeding.  You have questions about caring for yourself or your baby.  You pass a blood clot from your vagina. Get help right away if:  You have chest pain.  You have difficulty breathing.  You have sudden, severe leg pain.  You have severe pain or cramping in your lower abdomen.  You bleed from your vagina so much that you fill more than one sanitary pad in one hour. Bleeding should not be heavier than your heaviest period.  You develop a severe headache.  You faint.  You have blurred vision or spots in your vision.  You have bad-smelling vaginal discharge.  You have thoughts about hurting yourself or your baby. If you ever feel like you may hurt yourself or others, or have thoughts about taking your own life, get help right away. You can go to the nearest emergency department or call:  Your local emergency  services (911 in the [place]  A suicide  crisis helpline, such as the National Suicide Prevention Lifeline at 630-456-5154. This is open 24 hours a day. Summary  The period of time right after you deliver your newborn up to 6-12 weeks after delivery is called the postpartum period.  Gradually return to your normal activities as told by your health care provider.  Keep all follow-up visits for you and your baby as told by your health care provider. This information is not intended to replace advice given to you by your health care provider. Make sure you discuss any questions you have with your health care provider. Document Revised: 12/12/2017 Document Reviewed: 09/22/2017 Elsevier Patient Education  2020 [employer].

## 2020-02-02 NOTE — Lactation Note (Signed)
This note was copied from a baby's chart. Lactation Consultation Note  Patient Name: Girl Otelia Hettinger [id number] Date: 02/02/2020 Reason for consult: Follow-up assessment;Infant < 6lbs;Late-preterm 34-36.6wks Baby is 49 hours old/5% weight loss.  Baby is receiving Neosure 22.  Mom states she pumped yesterday.  Stressed importance of pumping every 3 hours to establish a good milk supply.  Mom has no questions or concerns.  Maternal Data    Feeding Feeding Type: Bottle Fed - Formula Nipple Type: Slow - flow  LATCH Score                   Interventions    Lactation Tools Discussed/Used     Consult Status Consult Status: Follow-up Date: 02/03/20 Follow-up type: In-patient    Huston Foley 02/02/2020, 11:09 AM

## 2020-02-03 ENCOUNTER — Ambulatory Visit: Payer: Self-pay

## 2020-02-03 NOTE — Lactation Note (Signed)
This note was copied from a baby's chart. Lactation Consultation Note  Patient Name: Christina Velazquez LEXNT'Z Date: 02/03/2020 Reason for consult: Follow-up assessment;Late-preterm 34-36.6wks;1st time breastfeeding;Primapara;Infant < 6lbs  [id number] F/U visit with P1 mom who delivered @ 35.4wks, baby is now about 71 days old with 4% wt loss; anticipated discharge today.  LC entered room to find mom in bed with baby on her chest, states baby just fed. Mom reports she thinks her milk is in as her breasts feel heavier and are leaking more. Mom has picture on her phone of what she pumped yesterday and looks to be about half an ounce mature milk in one bottle and a little over an ounce in the other. Mom states she pumped twice yesterday. Mom states she is moving 3 hours away this weekend. Mom started with [facility] about 2-3 weeks ago but is already enrolled in [facility] in the county she is moving to.  Mom states she has not heard from [facility] office yet and is eager to get a pump before she leaves town. Encouraged mom to reach out to [facility] office today while she is awaiting discharge. LC searched phone number via Internet and provided to mom. Informed mom that WIC can provide Medela DEBP so encouraged mom to take her tubing and accessories with her at discharge.  Strongly encouraged mom to pump q3hrs regularly. Explained importance of consistent stimulation in first two weeks in establishing long term milk supply. Also reviewed pump disassembly and cleaning of pump parts. Discouraged mom from allowing pieces to soak in water prior to washing. Mom denies any pain or discomfort with pumping. Instructed mom now that she has mature milk, she does not need to use initiation setting on pump at bedside.   Mom provided with manual pump and instructed in use.  Mom states baby is not going to the breast at all. Mom states interdisciplinary staff changed baby's bottles to Dr. Theora Gianotti yesterday and mom has been  following that feeding plan. Encouraged skin to skin as much as possible and offering breast as desired by infant. Encouraged mom to follow up with [facility] for assistance in getting baby to latch to breast.  Reviewed engorgement prevention and relief techniques with ice instead of heat.  Reviewed resources available to her including her local WIC office, LC OP services if desired, LC phone number and support group information on [email or web address].   Interventions Interventions: Skin to skin;DEBP  Lactation Tools Discussed/Used Pump Review: Setup, frequency, and cleaning   Consult Status Consult Status: Complete    Christina Velazquez 02/03/2020, 8:26 AM

## 2020-02-07 ENCOUNTER — Encounter: Payer: Medicaid Other | Admitting: Nurse Practitioner

## 2020-02-10 ENCOUNTER — Ambulatory Visit: Payer: Medicaid Other

## 2020-02-11 ENCOUNTER — Ambulatory Visit: Payer: Medicaid Other

## 2020-02-14 ENCOUNTER — Telehealth: Payer: Medicaid Other | Admitting: Advanced Practice Midwife

## 2020-02-21 ENCOUNTER — Telehealth: Payer: Medicaid Other | Admitting: Medical

## 2020-02-29 ENCOUNTER — Telehealth: Payer: Medicaid Other | Admitting: Nurse Practitioner

## 2020-03-07 ENCOUNTER — Ambulatory Visit: Payer: Medicaid Other | Admitting: Student

## 2020-09-11 IMAGING — DX PORTABLE CHEST - 1 VIEW
1 series · 1 of 1 positions shown · non-contrast
Comparison: 01/31/2017

CLINICAL DATA: Chest pain. Vomiting. Patient reported positive
pregnancy test at [HOSPITAL].

EXAM:
PORTABLE CHEST 1 VIEW

[chest ap]
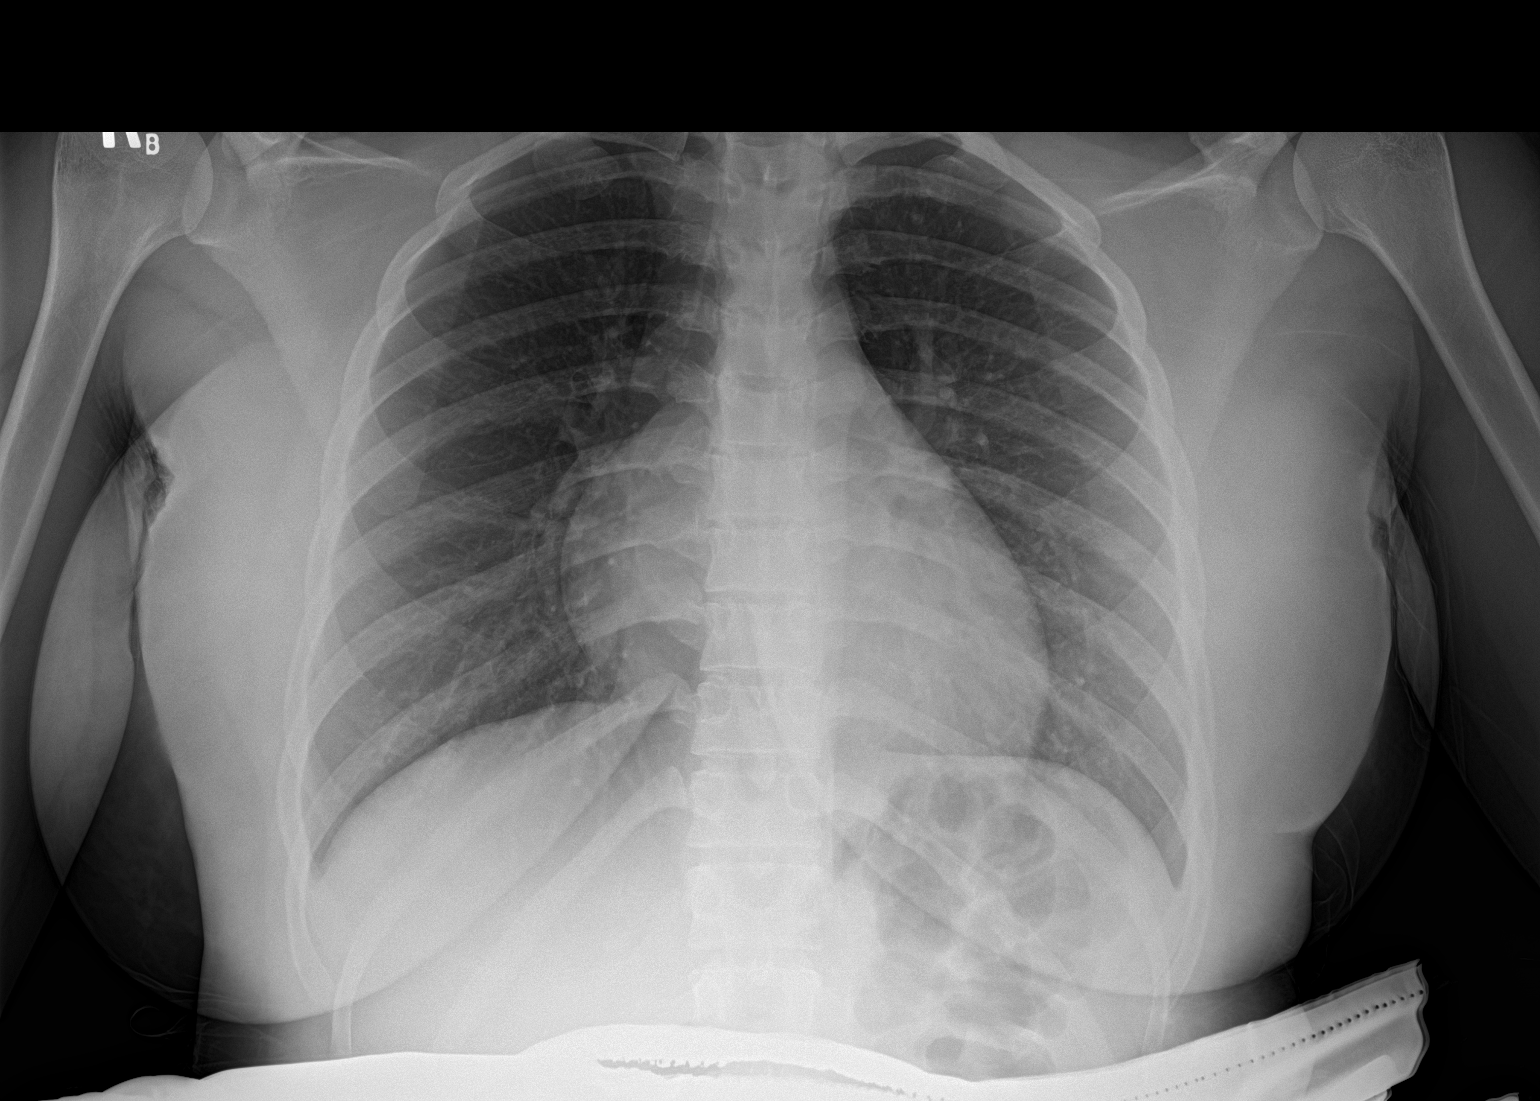

[1 of 1 positions shown; findings below may reference images not displayed]

FINDINGS: The cardiomediastinal contours are normal. The lungs are clear.
Pulmonary vasculature is normal. No consolidation, pleural effusion,
or pneumothorax. No acute osseous abnormalities are seen.
IMPRESSION: Negative AP view of the chest.
